# Patient Record
Sex: Male | Born: 1990 | Race: Black or African American | Hispanic: No | Marital: Single | Smoking: Former smoker
Health system: Southern US, Community
[De-identification: ages and names within clinical notes are randomized; demographics above are authoritative.]

## PROBLEM LIST (undated history)

## (undated) HISTORY — PX: CIRCUMCISION REVISION: SHX1347

---
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
## 2010-05-29 ENCOUNTER — Emergency Department (HOSPITAL_COMMUNITY): Admission: EM | Admit: 2010-05-29 | Discharge: 2010-05-29 | Payer: Self-pay | Admitting: Emergency Medicine

## 2011-05-01 NOTE — Op Note (Signed)
[place]  Patient:    Jerry Brooks, Jerry Brooks                  MRN: [id number] Proc. Date: 10/28/00 Adm. Date:  [id number] Attending:  Carlos Levering CC:         [facility]   Operative Report  PREOPERATIVE DIAGNOSIS:  Dense penile adhesions.  POSTOPERATIVE DIAGNOSIS:  Dense penile adhesions.  PROCEDURE:  Lysis of dense penile adhesions and repair.  SURGEON:  Prabhakar D. Levie Heritage, M.D.  ASSISTANT:  Nurse.  ANESTHESIA:  Nurse.  OPERATIVE PROCEDURE:  Under satisfactory general anesthesia the patient in supine position, genitalia region was thoroughly prepped and draped in the usual manner.  A careful inspection revealed 2 dense adhesion bands, 1 located at 10 oclock and the other at 8 oclock position at the circumference of the glands penis.  Both these adhesions bands were exposed and cut with electrocautery.  Debridement was done of the edges and repair was done with 4-0 chromic as well as 5-0 chromic interrupted and some running interlocking sutures.  Hemostasis was satisfactory.  Neosporin applied throughout the procedure.  The patients vital signs remained stable.  The patient withstood the procedure well and was transferred to recovery room in satisfactory general condition. DD:  10/28/00 TD:  10/28/00 Job: [id number]

## 2011-07-26 ENCOUNTER — Emergency Department (HOSPITAL_COMMUNITY): Payer: No Typology Code available for payment source

## 2011-07-26 ENCOUNTER — Emergency Department (HOSPITAL_COMMUNITY)
Admission: EM | Admit: 2011-07-26 | Discharge: 2011-07-26 | Disposition: A | Discharge status: Home / Self Care | Payer: No Typology Code available for payment source | Attending: Emergency Medicine | Admitting: Emergency Medicine

## 2011-07-26 DIAGNOSIS — M7989 Other specified soft tissue disorders: Secondary | ICD-10-CM | POA: Insufficient documentation

## 2011-07-26 DIAGNOSIS — M542 Cervicalgia: Secondary | ICD-10-CM | POA: Insufficient documentation

## 2011-07-26 DIAGNOSIS — S41109A Unspecified open wound of unspecified upper arm, initial encounter: Secondary | ICD-10-CM | POA: Insufficient documentation

## 2011-07-26 DIAGNOSIS — R51 Headache: Secondary | ICD-10-CM | POA: Insufficient documentation

## 2011-07-26 DIAGNOSIS — T07XXXA Unspecified multiple injuries, initial encounter: Secondary | ICD-10-CM | POA: Insufficient documentation

## 2011-07-26 DIAGNOSIS — R079 Chest pain, unspecified: Secondary | ICD-10-CM | POA: Insufficient documentation

## 2011-07-26 DIAGNOSIS — S0003XA Contusion of scalp, initial encounter: Secondary | ICD-10-CM | POA: Insufficient documentation

## 2011-07-26 DIAGNOSIS — IMO0002 Reserved for concepts with insufficient information to code with codable children: Secondary | ICD-10-CM | POA: Insufficient documentation

## 2011-07-26 DIAGNOSIS — M79609 Pain in unspecified limb: Secondary | ICD-10-CM | POA: Insufficient documentation

## 2011-07-26 DIAGNOSIS — S61409A Unspecified open wound of unspecified hand, initial encounter: Secondary | ICD-10-CM | POA: Insufficient documentation

## 2011-07-26 DIAGNOSIS — S0180XA Unspecified open wound of other part of head, initial encounter: Secondary | ICD-10-CM | POA: Insufficient documentation

## 2011-07-26 DIAGNOSIS — S01409A Unspecified open wound of unspecified cheek and temporomandibular area, initial encounter: Secondary | ICD-10-CM | POA: Insufficient documentation

## 2011-07-26 DIAGNOSIS — S40029A Contusion of unspecified upper arm, initial encounter: Secondary | ICD-10-CM | POA: Insufficient documentation

## 2011-07-26 DIAGNOSIS — S0120XA Unspecified open wound of nose, initial encounter: Secondary | ICD-10-CM | POA: Insufficient documentation

## 2011-07-26 LAB — BASIC METABOLIC PANEL
BUN: 7 mg/dL (ref 6–23)
CO2: 26 mEq/L (ref 19–32)
Calcium: 9.3 mg/dL (ref 8.4–10.5)
GFR calc non Af Amer: >60 mL/min (ref >60)
Glucose, Bld: 94 mg/dL (ref 70–99)

## 2011-07-26 LAB — DIFFERENTIAL
Eosinophils Relative: 1 % (ref 0–5)
Lymphocytes Relative: 39 % (ref 12–46)
Lymphs Abs: 2.9 10*3/uL (ref 0.7–4.0)
Monocytes Absolute: 0.7 10*3/uL (ref 0.1–1.0)
Monocytes Relative: 10 % (ref 3–12)
Neutro Abs: 3.6 10*3/uL (ref 1.7–7.7)

## 2011-07-26 LAB — CBC
HCT: 44.1 % (ref 39.0–52.0)
Hemoglobin: 15.8 g/dL (ref 13.0–17.0)
MCH: 32.6 pg (ref 26.0–34.0)
MCHC: 35.8 g/dL (ref 30.0–36.0)
MCV: 91.1 fL (ref 78.0–100.0)
RDW: 12.5 % (ref 11.5–15.5)

## 2011-07-27 ENCOUNTER — Emergency Department (HOSPITAL_COMMUNITY)
Admission: EM | Admit: 2011-07-27 | Discharge: 2011-07-27 | Discharge status: Against Medical Advice | Payer: Self-pay | Attending: Emergency Medicine | Admitting: Emergency Medicine

## 2011-07-27 ENCOUNTER — Emergency Department (HOSPITAL_COMMUNITY)
Admission: EM | Admit: 2011-07-27 | Discharge: 2011-07-27 | Discharge status: Against Medical Advice | Payer: No Typology Code available for payment source | Attending: Emergency Medicine | Admitting: Emergency Medicine

## 2011-07-27 DIAGNOSIS — R51 Headache: Secondary | ICD-10-CM | POA: Insufficient documentation

## 2011-07-27 DIAGNOSIS — M549 Dorsalgia, unspecified: Secondary | ICD-10-CM | POA: Insufficient documentation

## 2011-07-27 DIAGNOSIS — L988 Other specified disorders of the skin and subcutaneous tissue: Secondary | ICD-10-CM | POA: Insufficient documentation

## 2011-07-27 DIAGNOSIS — M25519 Pain in unspecified shoulder: Secondary | ICD-10-CM | POA: Insufficient documentation

## 2011-07-27 DIAGNOSIS — M542 Cervicalgia: Secondary | ICD-10-CM | POA: Insufficient documentation

## 2011-07-31 ENCOUNTER — Emergency Department (HOSPITAL_COMMUNITY)
Admission: EM | Admit: 2011-07-31 | Discharge: 2011-07-31 | Disposition: A | Discharge status: Home / Self Care | Payer: No Typology Code available for payment source | Attending: Emergency Medicine | Admitting: Emergency Medicine

## 2011-07-31 DIAGNOSIS — IMO0002 Reserved for concepts with insufficient information to code with codable children: Secondary | ICD-10-CM | POA: Insufficient documentation

## 2011-07-31 DIAGNOSIS — F411 Generalized anxiety disorder: Secondary | ICD-10-CM | POA: Insufficient documentation

## 2011-07-31 DIAGNOSIS — S0510XA Contusion of eyeball and orbital tissues, unspecified eye, initial encounter: Secondary | ICD-10-CM | POA: Insufficient documentation

## 2011-07-31 DIAGNOSIS — H5710 Ocular pain, unspecified eye: Secondary | ICD-10-CM | POA: Insufficient documentation

## 2011-07-31 DIAGNOSIS — R51 Headache: Secondary | ICD-10-CM | POA: Insufficient documentation

## 2011-09-22 LAB — CULTURE, ROUTINE-ABSCESS

## 2012-12-23 ENCOUNTER — Emergency Department (HOSPITAL_COMMUNITY)
Admission: EM | Admit: 2012-12-23 | Discharge: 2012-12-23 | Disposition: A | Discharge status: Home / Self Care | Payer: Self-pay | Attending: Emergency Medicine | Admitting: Emergency Medicine

## 2012-12-23 ENCOUNTER — Encounter (HOSPITAL_COMMUNITY): Payer: Self-pay

## 2012-12-23 DIAGNOSIS — K089 Disorder of teeth and supporting structures, unspecified: Secondary | ICD-10-CM | POA: Insufficient documentation

## 2012-12-23 DIAGNOSIS — F17200 Nicotine dependence, unspecified, uncomplicated: Secondary | ICD-10-CM | POA: Insufficient documentation

## 2012-12-23 DIAGNOSIS — K0889 Other specified disorders of teeth and supporting structures: Secondary | ICD-10-CM

## 2012-12-23 MED ORDER — KETOROLAC TROMETHAMINE 60 MG/2ML IM SOLN
60.0000 mg | Freq: Once | INTRAMUSCULAR | Status: AC
  Administered 2012-12-23: 60 mg via INTRAMUSCULAR
  Filled 2012-12-23: qty 2

## 2012-12-23 MED ORDER — PENICILLIN V POTASSIUM 500 MG PO TABS: 500.0000 mg | ORAL_TABLET | Freq: Four times a day (QID) | ORAL | Status: DC

## 2012-12-23 MED ORDER — NAPROXEN 500 MG PO TABS: 500.0000 mg | ORAL_TABLET | Freq: Two times a day (BID) | ORAL | Status: DC

## 2012-12-23 NOTE — ED Notes (Signed)
Pt dc to home.  Pt states understanding to dc paperwork.  No adv rx noted to injection.  Pt ambulatory to exit.

## 2012-12-23 NOTE — ED Notes (Signed)
Pt c/o toothache x 1 month.  States is unable to f/u with dentist.

## 2012-12-23 NOTE — ED Provider Notes (Signed)
History     CSN: [id number]  Arrival date & time 12/23/12  0327   First MD Initiated Contact with Patient 12/23/12 [phone]      Chief Complaint  Patient presents with  . Dental Pain    (Consider location/radiation/quality/duration/timing/severity/associated sxs/prior treatment) HPI Comments: 22 year old male with a history of dental pain in his right upper rear molar who presents with 2 months of persistent pain which is gradually worsening has become moderate to severe overnight. He denies swelling, fever, chills, nausea, vomiting or difficulty swallowing or speaking. The symptoms were gradual in onset and have gradually gotten worse and he has not seen a dentist for them.  The history is provided by the patient.    History reviewed. No pertinent past medical history.  No past surgical history on file.  No family history on file.  History  Substance Use Topics  . Smoking status: Current Every Day Smoker  . Smokeless tobacco: Not on file  . Alcohol Use: 1.2 oz/week    2 Cans of beer per week      Review of Systems  Constitutional: Negative for fever and chills.  HENT: Positive for dental problem. Negative for sore throat, facial swelling, trouble swallowing and voice change.        Toothache  Gastrointestinal: Negative for nausea and vomiting.    Allergies  Review of patient's allergies indicates no known allergies.  Home Medications   Current Outpatient Rx  Name  Route  Sig  Dispense  Refill  . NAPROXEN 500 MG PO TABS   Oral   Take 1 tablet (500 mg total) by mouth 2 (two) times daily with a meal.   30 tablet   0   . PENICILLIN V POTASSIUM 500 MG PO TABS   Oral   Take 1 tablet (500 mg total) by mouth 4 (four) times daily.   40 tablet   0     BP 117/70  Temp 97.2 F (36.2 C) (Oral)  Resp 20  SpO2 97%  Physical Exam  Nursing note and vitals reviewed. Constitutional: He appears well-developed and well-nourished. No distress.  HENT:  Head:  Normocephalic and atraumatic.  Mouth/Throat: Oropharynx is clear and moist. No oropharyngeal exudate.       Dental Disease - right upper rear molar with deep caries, no surrounding gingival swelling or abscess, no asymmetry to the jaw, no pain in the sublingual area to palpation  Eyes: Conjunctivae normal are normal. No scleral icterus.  Neck: Normal range of motion. Neck supple. No thyromegaly present.  Cardiovascular: Normal rate and regular rhythm.   Pulmonary/Chest: Effort normal and breath sounds normal.  Lymphadenopathy:    He has no cervical adenopathy.  Neurological: He is alert.  Skin: Skin is warm and dry. No rash noted. He is not diaphoretic.    ED Course  Procedures (including critical care time)  Labs Reviewed - No data to display No results found.   1. Toothache       MDM  Overall the patient is well-appearing, he does have disease to his right upper molar however this time antibiotics and pain medication oral is indicated with dental followup, information given including on-call dentist phone number.  Home with Naprosyn and penicillin        Vida Roller, MD 12/23/12 [phone]

## 2013-03-25 ENCOUNTER — Encounter (HOSPITAL_COMMUNITY): Payer: Self-pay | Admitting: Emergency Medicine

## 2013-03-25 ENCOUNTER — Emergency Department (HOSPITAL_COMMUNITY)
Admission: EM | Admit: 2013-03-25 | Discharge: 2013-03-25 | Disposition: A | Discharge status: Home / Self Care | Payer: Self-pay | Attending: Emergency Medicine | Admitting: Emergency Medicine

## 2013-03-25 DIAGNOSIS — Z8669 Personal history of other diseases of the nervous system and sense organs: Secondary | ICD-10-CM | POA: Insufficient documentation

## 2013-03-25 DIAGNOSIS — F17200 Nicotine dependence, unspecified, uncomplicated: Secondary | ICD-10-CM | POA: Insufficient documentation

## 2013-03-25 DIAGNOSIS — L0291 Cutaneous abscess, unspecified: Secondary | ICD-10-CM

## 2013-03-25 DIAGNOSIS — L02219 Cutaneous abscess of trunk, unspecified: Secondary | ICD-10-CM | POA: Insufficient documentation

## 2013-03-25 MED ORDER — HYDROCODONE-ACETAMINOPHEN 5-325 MG PO TABS: 2.0000 | ORAL_TABLET | ORAL | Status: DC | PRN

## 2013-03-25 MED ORDER — SULFAMETHOXAZOLE-TRIMETHOPRIM 800-160 MG PO TABS: 2.0000 | ORAL_TABLET | Freq: Two times a day (BID) | ORAL | Status: DC

## 2013-03-25 NOTE — ED Notes (Signed)
Patient discharged with family  Instructions given using the teach back method, patient verbalizes an understanding. Prescriptions given. NAD noted at the time of discharge.

## 2013-03-25 NOTE — ED Provider Notes (Signed)
History     CSN: [id number]  Arrival date & time 03/25/13  4098   First MD Initiated Contact with Patient 03/25/13 [phone]      Chief Complaint  Patient presents with  . Abscess    (Consider location/radiation/quality/duration/timing/severity/associated sxs/prior treatment) HPI Comments: Patient reports that he started noticing pain in the right groin area yesterday. Woke up this morning he noticed a tender, red and lump. He reports that he has had abscesses before. This feels similar. There has not been any drainage. Patient reports that when he is sitting there is no pain, but when he stands up or walks and the area gets rubbed, pain is severe.  Patient is a 22 y.o. male presenting with abscess.  Abscess Associated symptoms: no fever     No past medical history on file.  No past surgical history on file.  No family history on file.  History  Substance Use Topics  . Smoking status: Current Every Day Smoker  . Smokeless tobacco: Not on file  . Alcohol Use: 1.2 oz/week    2 Cans of beer per week      Review of Systems  Constitutional: Negative for fever.  Skin: Positive for color change.    Allergies  Review of patient's allergies indicates no known allergies.  Home Medications   Current Outpatient Rx  Name  Route  Sig  Dispense  Refill  . naproxen (NAPROSYN) 500 MG tablet   Oral   Take 1 tablet (500 mg total) by mouth 2 (two) times daily with a meal.   30 tablet   0   . penicillin v potassium (VEETID) 500 MG tablet   Oral   Take 1 tablet (500 mg total) by mouth 4 (four) times daily.   40 tablet   0     There were no vitals taken for this visit.  Physical Exam  Constitutional: He is oriented to person, place, and time. He appears well-developed and well-nourished. No distress.  HENT:  Head: Normocephalic and atraumatic.  Right Ear: Hearing normal.  Mouth/Throat: Mucous membranes are normal.  Eyes: Pupils are equal, round, and reactive to light.   Neck: Normal range of motion.  Cardiovascular: Normal rate, regular rhythm, S1 normal and S2 normal.  Exam reveals no gallop and no friction rub.   No murmur heard. Pulmonary/Chest: Effort normal. No respiratory distress. He exhibits no tenderness.  Abdominal: Soft. Normal appearance. There is no hepatosplenomegaly. There is no tenderness. There is no rebound, no guarding, no tenderness at McBurney's point and negative Murphy's sign. No hernia.  Genitourinary: Penis normal.  Musculoskeletal: Normal range of motion.  Neurological: He is alert and oriented to person, place, and time. He has normal strength. No cranial nerve deficit or sensory deficit. Coordination normal. GCS eye subscore is 4. GCS verbal subscore is 5. GCS motor subscore is 6.  Skin: Skin is warm, dry and intact. No rash noted. No cyanosis.  1.5 cm x 3 cm area of erythema immediately adjacent to the scrotum on the right side in the groin area. Area is tender to the touch. Not indurated and no clear fluctuance.  Psychiatric: He has a normal mood and affect. His speech is normal and behavior is normal. Thought content normal.    ED Course  Procedures (including critical care time)  Labs Reviewed - No data to display No results found.   Diagnosis: Abscess    MDM  Patient presents to the ER with an early skin abscess. Although it  has only been present for approximately 24 hours, I do suspect that there is some fluid in it based on examination. I recommended incision and drainage to the patient. He has had this seizure performed before and does not wish to undergo at this time. He has if he can be placed on antibiotics and followup in 24 hours if not improving. I did counsel him about the possibility of extension into the perirectal region as well as the scrotum, including life-threatening problem such as Fournier's gangrene. He understands the risks, but prefers conservative treatment at this time. Patient will be placed on  high-dose Bactrim regimen, given analgesia. Patient is to return at any point if the area worsens. He was told to come back in the next 24-48 hours if he is not seeing improvement.      Gilda Crease, MD 03/25/13 [phone]

## 2013-03-25 NOTE — ED Notes (Signed)
Patient presents to the ED with complaints of pain in the right groin area since. He states I have an abscess. Edema and redness noted to the right groin area.

## 2013-03-26 ENCOUNTER — Encounter (HOSPITAL_COMMUNITY): Payer: Self-pay | Admitting: Emergency Medicine

## 2013-03-26 ENCOUNTER — Emergency Department (HOSPITAL_COMMUNITY)
Admission: EM | Admit: 2013-03-26 | Discharge: 2013-03-26 | Disposition: A | Discharge status: Home Health | Payer: Self-pay | Attending: Emergency Medicine | Admitting: Emergency Medicine

## 2013-03-26 DIAGNOSIS — F17200 Nicotine dependence, unspecified, uncomplicated: Secondary | ICD-10-CM | POA: Insufficient documentation

## 2013-03-26 DIAGNOSIS — L02419 Cutaneous abscess of limb, unspecified: Secondary | ICD-10-CM | POA: Insufficient documentation

## 2013-03-26 DIAGNOSIS — L0291 Cutaneous abscess, unspecified: Secondary | ICD-10-CM

## 2013-03-26 DIAGNOSIS — L539 Erythematous condition, unspecified: Secondary | ICD-10-CM | POA: Insufficient documentation

## 2013-03-26 NOTE — ED Notes (Signed)
Pt discharged to home with family. NAD.  

## 2013-03-26 NOTE — ED Notes (Signed)
Pt presents to ED today with c/o abscess on right inner thigh. Pt states he was here yesterday and got antibiotics, but pain is worse today and wants abscess lanced. NAD.

## 2013-03-26 NOTE — ED Provider Notes (Signed)
History     CSN: [id number]  Arrival date & time 03/26/13  6295   First MD Initiated Contact with Patient 03/26/13 [phone]      Chief Complaint  Patient presents with  . Abscess    (Consider location/radiation/quality/duration/timing/severity/associated sxs/prior treatment) HPI Comments: Patient presents complaining of pain associated with an abscess to his inner right thigh. States the pain is pressure-like in nature and nonradiating, worsening since onset. Patient was evaluated yesterday for abscess and discharged with Bactrim course; patient denied incision and drainage at this time. Patient states that redness and swelling have improved, but pain has increased. He denies any fever, scrotal or penile swelling, penile discharge, numbness or tingling in his lower extremities, and urinary symptoms.    Patient's abscess began to spontaneously drain copious amounts of yellow puslike discharge during gathering of the HPI. Patient endorses improvement to pressure-like pain sensation.  Patient is a 22 y.o. male presenting with abscess. The history is provided by the patient. No language interpreter was used.  Abscess Associated symptoms: no fever     History reviewed. No pertinent past medical history.  History reviewed. No pertinent past surgical history.  History reviewed. No pertinent family history.  History  Substance Use Topics  . Smoking status: Current Every Day Smoker -- 0.50 packs/day    Types: Cigarettes  . Smokeless tobacco: Not on file  . Alcohol Use: 1.2 oz/week    2 Cans of beer per week      Review of Systems  Constitutional: Negative for fever and chills.  Gastrointestinal: Negative for rectal pain.  Genitourinary: Negative for dysuria, hematuria, penile swelling, scrotal swelling and testicular pain.  Skin: Positive for color change. Negative for pallor.  Neurological: Negative for weakness and numbness.  All other systems reviewed and are  negative.    Allergies  Review of patient's allergies indicates no known allergies.  Home Medications   Current Outpatient Rx  Name  Route  Sig  Dispense  Refill  . HYDROcodone-acetaminophen (NORCO/VICODIN) 5-325 MG per tablet   Oral   Take 2 tablets by mouth every 4 (four) hours as needed for pain.   10 tablet   0   . sulfamethoxazole-trimethoprim (SEPTRA DS) 800-160 MG per tablet   Oral   Take 2 tablets by mouth every 12 (twelve) hours.   40 tablet   0     BP 131/71  Pulse 101  Temp(Src) 98.8 F (37.1 C) (Oral)  Ht 5\' 11"  (1.803 m)  Wt 180 lb (81.647 kg)  BMI 25.12 kg/m2  SpO2 99%  Physical Exam  Nursing note and vitals reviewed. Constitutional: He is oriented to person, place, and time. He appears well-developed and well-nourished. No distress.  HENT:  Head: Normocephalic and atraumatic.  Eyes: Conjunctivae are normal. No scleral icterus.  Neck: Normal range of motion.  Cardiovascular: Intact distal pulses.   DP and PT pulses 2+ b/l  Pulmonary/Chest: Effort normal.  Neurological: He is alert and oriented to person, place, and time.  Skin: He is not diaphoretic.  Abscess to R inner thigh with actively draining central area of fluctuance; drainage yellow in color and pus-like. Area of central fluctuance approximately 3.5cmx1cm. Mild surrounding erythema extending approximately 1cm from area of central fluctuance. No warmth or tenderness to palpation.  Psychiatric: He has a normal mood and affect. His behavior is normal.    ED Course  Procedures (including critical care time)  Labs Reviewed - No data to display No results found.   1.  Abscess     MDM  Patient presents for pain secondary to abscess of R inner thigh. Patient evaluated yesterday by Dr. Blinda Leatherwood who d/c'd patient on bactrim. During ED visit today, abscess began to spontaneously drain copious amounts of yellow pus-like discharge which relieved patient's pain.   I discussed with the patient the  option of opening the wound further with incision to promote better drainage versus continuing warm soaks. Patient would prefer to continue the warm soaks rather than have incision and drainage performed today. Have advised patient to continue antibiotics for full course as well. Indications for ED return discussed. Patient states comfort and understanding with this discharge plan and no unaddressed concerns. Patient well and nontoxic appearing in NAD; stable for d/c with PCP follow up. Patient ED work up and management discussed with Dr. Blinda Leatherwood who is in agreement.       Antony Madura, PA-C 03/28/13 2158

## 2013-03-28 NOTE — ED Provider Notes (Signed)
Medical screening examination/treatment/procedure(s) were performed by non-physician practitioner and as supervising physician I was immediately available for consultation/collaboration.  Gilda Crease, MD 03/28/13 2216

## 2013-11-30 ENCOUNTER — Emergency Department (HOSPITAL_COMMUNITY)
Admission: EM | Admit: 2013-11-30 | Discharge: 2013-11-30 | Disposition: A | Discharge status: Home / Self Care | Payer: No Typology Code available for payment source | Attending: Emergency Medicine | Admitting: Emergency Medicine

## 2013-11-30 ENCOUNTER — Encounter (HOSPITAL_COMMUNITY): Payer: Self-pay | Admitting: Emergency Medicine

## 2013-11-30 DIAGNOSIS — K047 Periapical abscess without sinus: Secondary | ICD-10-CM | POA: Insufficient documentation

## 2013-11-30 DIAGNOSIS — F17200 Nicotine dependence, unspecified, uncomplicated: Secondary | ICD-10-CM | POA: Insufficient documentation

## 2013-11-30 DIAGNOSIS — K029 Dental caries, unspecified: Secondary | ICD-10-CM | POA: Insufficient documentation

## 2013-11-30 MED ORDER — PENICILLIN V POTASSIUM 500 MG PO TABS: 500.0000 mg | ORAL_TABLET | Freq: Four times a day (QID) | ORAL | Status: AC

## 2013-11-30 MED ORDER — IBUPROFEN 800 MG PO TABS: 800.0000 mg | ORAL_TABLET | Freq: Three times a day (TID) | ORAL | Status: DC

## 2013-11-30 MED ORDER — OXYCODONE-ACETAMINOPHEN 5-325 MG PO TABS
2.0000 | ORAL_TABLET | Freq: Once | ORAL | Status: AC
  Administered 2013-11-30: 2 via ORAL
  Filled 2013-11-30: qty 2

## 2013-11-30 NOTE — ED Notes (Signed)
Pt presents with c/o toothache on the top right  States it started about 9pm last night

## 2013-12-01 NOTE — ED Provider Notes (Signed)
Medical screening examination/treatment/procedure(s) were performed by non-physician practitioner and as supervising physician I was immediately available for consultation/collaboration.  Hurman Horn, MD 12/01/13 2049

## 2013-12-11 NOTE — ED Provider Notes (Signed)
Medical screening examination/treatment/procedure(s) were performed by non-physician practitioner and as supervising physician I was immediately available for consultation/collaboration.  Hurman Horn, MD 12/11/13 2132

## 2013-12-11 NOTE — ED Provider Notes (Signed)
CSN: [id number]     Arrival date & time 11/30/13  0534 History   First MD Initiated Contact with Patient 11/30/13 [phone]     Chief Complaint  Patient presents with  . Dental Pain   (Consider location/radiation/quality/duration/timing/severity/associated sxs/prior Treatment) Patient is a 22 y.o. male presenting with tooth pain.  Dental Pain Toothache location: Right upper dentition. Severity:  Moderate Onset quality:  Sudden Duration:  1 day Timing:  Constant Progression:  Worsening Chronicity:  New Context: dental caries and poor dentition   Context: not trauma   Worsened by:  Touching and pressure Associated symptoms: no difficulty swallowing, no drooling, no facial swelling, no fever, no neck pain, no neck swelling, no oral bleeding, no oral lesions and no trismus   Risk factors: lack of dental care, periodontal disease and smoking     History reviewed. No pertinent past medical history. History reviewed. No pertinent past surgical history. Family History  Problem Relation Age of Onset  . Diabetes Other   . Hypertension Other    History  Substance Use Topics  . Smoking status: Current Every Day Smoker -- 0.50 packs/day    Types: Cigarettes  . Smokeless tobacco: Not on file  . Alcohol Use: 0.0 oz/week     Comment: occ    Review of Systems  Constitutional: Negative for fever.  HENT: Positive for dental problem. Negative for drooling, facial swelling, mouth sores and trouble swallowing.   Respiratory: Negative for shortness of breath.   Musculoskeletal: Negative for neck pain and neck stiffness.  All other systems reviewed and are negative.    Allergies  Review of patient's allergies indicates no known allergies.  Home Medications   Current Outpatient Rx  Name  Route  Sig  Dispense  Refill  . ibuprofen (ADVIL,MOTRIN) 200 MG tablet   Oral   Take 200 mg by mouth every 6 (six) hours as needed.         Marland Kitchen ibuprofen (ADVIL,MOTRIN) 800 MG tablet   Oral   Take 1  tablet (800 mg total) by mouth 3 (three) times daily.   21 tablet   0    BP 118/70  Pulse 80  Temp(Src) 98.1 F (36.7 C) (Oral)  Ht 5\' 11"  (1.803 m)  Wt 185 lb (83.915 kg)  BMI 25.81 kg/m2  SpO2 97%  Physical Exam  Nursing note and vitals reviewed. Constitutional: He is oriented to person, place, and time. He appears well-developed and well-nourished. No distress.  HENT:  Head: Normocephalic and atraumatic.  Right Ear: External ear normal. No tenderness. No mastoid tenderness.  Left Ear: External ear normal. No tenderness. No mastoid tenderness.  Nose: Nose normal.  Mouth/Throat: Uvula is midline, oropharynx is clear and moist and mucous membranes are normal. No trismus in the jaw. Abnormal dentition. Dental caries present.  Gingival and buccal swelling adjacent to right upper dentition. No oral lesions or bleeding. No trismus. Patient tolerating secretions without difficulty.  Eyes: Conjunctivae and EOM are normal. Pupils are equal, round, and reactive to light. No scleral icterus.  Neck: Normal range of motion. Neck supple.  No nuchal rigidity or meningismus  Pulmonary/Chest: Effort normal. No stridor. No respiratory distress.  Musculoskeletal: Normal range of motion.  Neurological: He is alert and oriented to person, place, and time.  Skin: Skin is warm and dry. No rash noted. He is not diaphoretic. No erythema. No pallor.  Psychiatric: He has a normal mood and affect. His behavior is normal.    ED Course  Procedures (  including critical care time) Labs Review Labs Reviewed - No data to display Imaging Review No results found.  EKG Interpretation   None       MDM   1. Dental abscess    Uncomplicated dental abscess. Patient well and nontoxic appearing, hemodynamically stable, and afebrile. Patient tolerating secretions without difficulty. No red flags or signs concerning for Ludwig's angina. Have urged dental followup and discuss with patient the need for drainage  of dental abscess. Will prescribe penicillin for symptoms and have advised ibuprofen for pain control. Return precautions discussed and patient agreeable to plan with no unaddressed concerns.    Antony Madura, PA-C 12/11/13 1942

## 2014-01-04 ENCOUNTER — Encounter (HOSPITAL_COMMUNITY): Payer: Self-pay | Admitting: Emergency Medicine

## 2014-01-04 ENCOUNTER — Emergency Department (HOSPITAL_COMMUNITY)
Admission: EM | Admit: 2014-01-04 | Discharge: 2014-01-04 | Disposition: A | Discharge status: Home / Self Care | Payer: No Typology Code available for payment source | Attending: Emergency Medicine | Admitting: Emergency Medicine

## 2014-01-04 DIAGNOSIS — F17200 Nicotine dependence, unspecified, uncomplicated: Secondary | ICD-10-CM | POA: Insufficient documentation

## 2014-01-04 DIAGNOSIS — K089 Disorder of teeth and supporting structures, unspecified: Secondary | ICD-10-CM | POA: Insufficient documentation

## 2014-01-04 DIAGNOSIS — K044 Acute apical periodontitis of pulpal origin: Secondary | ICD-10-CM | POA: Insufficient documentation

## 2014-01-04 DIAGNOSIS — K047 Periapical abscess without sinus: Secondary | ICD-10-CM

## 2014-01-04 DIAGNOSIS — K0889 Other specified disorders of teeth and supporting structures: Secondary | ICD-10-CM

## 2014-01-04 MED ORDER — IBUPROFEN 800 MG PO TABS: 800.0000 mg | ORAL_TABLET | Freq: Three times a day (TID) | ORAL | Status: DC

## 2014-01-04 MED ORDER — AMOXICILLIN 500 MG PO CAPS: 500.0000 mg | ORAL_CAPSULE | Freq: Three times a day (TID) | ORAL | Status: DC
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
## 2014-01-04 NOTE — Discharge Instructions (Signed)
Take antibiotic to completion.  Facial Infection You have an infection of your face. This requires special attention to help prevent serious problems. Infections in facial wounds can cause poor healing and scars. They can also spread to deeper tissues, especially around the eye. Wound and dental infections can lead to sinusitis, infection of the eye socket, and even meningitis. Permanent damage to the skin, eye, and nervous system may result if facial infections are not treated properly. With severe infections, hospital care for IV antibiotic injections may be needed if they don't respond to oral antibiotics. Antibiotics must be taken for the full course to insure the infection is eliminated. If the infection came from a bad tooth, it may have to be extracted when the infection is under control. Warm compresses may be applied to reduce skin irritation and remove drainage. You might need a tetanus shot now if:  You cannot remember when your last tetanus shot was.  You have never had a tetanus shot.  The object that caused your wound was dirty. If you need a tetanus shot, and you decide not to get one, there is a rare chance of getting tetanus. Sickness from tetanus can be serious. If you got a tetanus shot, your arm may swell, get red and warm to the touch at the shot site. This is common and not a problem. SEEK IMMEDIATE MEDICAL CARE IF:   You have increased swelling, redness, or trouble breathing.  You have a severe headache, dizziness, nausea, or vomiting.  You develop problems with your eyesight.  You have a fever. Document Released: 01/07/2005 Document Revised: 02/22/2012 Document Reviewed: 11/30/2005 [facility] Patient Information 2014 [place], [place].  Dental Pain A tooth ache may be caused by cavities (tooth decay). Cavities expose the nerve of the tooth to air and hot or cold temperatures. It may come from an infection or abscess (also called a boil or furuncle) around your tooth. It  is also often caused by dental caries (tooth decay). This causes the pain you are having. DIAGNOSIS  Your caregiver can diagnose this problem by exam. TREATMENT   If caused by an infection, it may be treated with medications which kill germs (antibiotics) and pain medications as prescribed by your caregiver. Take medications as directed.  Only take over-the-counter or prescription medicines for pain, discomfort, or fever as directed by your caregiver.  Whether the tooth ache today is caused by infection or dental disease, you should see your dentist as soon as possible for further care. SEEK MEDICAL CARE IF: The exam and treatment you received today has been provided on an emergency basis only. This is not a substitute for complete medical or dental care. If your problem worsens or new problems (symptoms) appear, and you are unable to meet with your dentist, call or return to this location. SEEK IMMEDIATE MEDICAL CARE IF:   You have a fever.  You develop redness and swelling of your face, jaw, or neck.  You are unable to open your mouth.  You have severe pain uncontrolled by pain medicine. MAKE SURE YOU:   Understand these instructions.  Will watch your condition.  Will get help right away if you are not doing well or get worse. Document Released: 11/30/2005 Document Revised: 02/22/2012 Document Reviewed: 07/18/2008 [facility] Patient Information 2014 [place], [place].  Dental Care and Dentist Visits Dental care supports good overall health. Regular dental visits can also help you avoid dental pain, bleeding, infection, and other more serious health problems in the future. It  is important to keep the mouth healthy because diseases in the teeth, gums, and other oral tissues can spread to other areas of the body. Some problems, such as diabetes, heart disease, and pre-term labor have been associated with poor oral health.  See your dentist every 6 months. If you experience emergency  problems such as a toothache or broken tooth, go to the dentist right away. If you see your dentist regularly, you may catch problems early. It is easier to be treated for problems in the early stages.  WHAT TO EXPECT AT A DENTIST VISIT  Your dentist will look for many common oral health problems and recommend proper treatment. At your regular dental visit, you can expect:  Gentle cleaning of the teeth and gums. This includes scraping and polishing. This helps to remove the sticky substance around the teeth and gums (plaque). Plaque forms in the mouth shortly after eating. Over time, plaque hardens on the teeth as tartar. If tartar is not removed regularly, it can cause problems. Cleaning also helps remove stains.  Periodic X-rays. These pictures of the teeth and supporting bone will help your dentist assess the health of your teeth.  Periodic fluoride treatments. Fluoride is a natural mineral shown to help strengthen teeth. Fluoride treatmentinvolves applying a fluoride gel or varnish to the teeth. It is most commonly done in children.  Examination of the mouth, tongue, jaws, teeth, and gums to look for any oral health problems, such as:  Cavities (dental caries). This is decay on the tooth caused by plaque, sugar, and acid in the mouth. It is best to catch a cavity when it is small.  Inflammation of the gums caused by plaque buildup (gingivitis).  Problems with the mouth or malformed or misaligned teeth.  Oral cancer or other diseases of the soft tissues or jaws. KEEP YOUR TEETH AND GUMS HEALTHY For healthy teeth and gums, follow these general guidelines as well as your dentist's specific advice:  Have your teeth professionally cleaned at the dentist every 6 months.  Brush twice daily with a fluoride toothpaste.  Floss your teeth daily.  Ask your dentist if you need fluoride supplements, treatments, or fluoride toothpaste.  Eat a healthy diet. Reduce foods and drinks with added  sugar.  Avoid smoking. TREATMENT FOR ORAL HEALTH PROBLEMS If you have oral health problems, treatment varies depending on the conditions present in your teeth and gums.  Your caregiver will most likely recommend good oral hygiene at each visit.  For cavities, gingivitis, or other oral health disease, your caregiver will perform a procedure to treat the problem. This is typically done at a separate appointment. Sometimes your caregiver will refer you to another dental specialist for specific tooth problems or for surgery. SEEK IMMEDIATE DENTAL CARE IF:  You have pain, bleeding, or soreness in the gum, tooth, jaw, or mouth area.  A permanent tooth becomes loose or separated from the gum socket.  You experience a blow or injury to the mouth or jaw area. Document Released: 08/12/2011 Document Revised: 02/22/2012 Document Reviewed: 08/12/2011 [facility] Patient Information 2014 [place].

## 2014-01-04 NOTE — ED Notes (Signed)
PT ambulated with baseline gait; VSS; A&Ox3; no signs of distress; respirations even and unlabored; skin warm and dry; no questions upon discharge.  

## 2014-01-04 NOTE — ED Notes (Signed)
Upper right back molar decayed and painful; nausea; no fevers or chills. No facial swelling noted

## 2014-01-05 NOTE — ED Provider Notes (Signed)
Medical screening examination/treatment/procedure(s) were performed by non-physician practitioner and as supervising physician I was immediately available for consultation/collaboration.  EKG Interpretation   None         Deysy Schabel W Joshus Rogan, MD 01/05/14 1143 

## 2014-02-04 ENCOUNTER — Encounter (HOSPITAL_COMMUNITY): Payer: Self-pay | Admitting: Emergency Medicine

## 2014-02-04 ENCOUNTER — Emergency Department (HOSPITAL_COMMUNITY)
Admission: EM | Admit: 2014-02-04 | Discharge: 2014-02-04 | Disposition: A | Discharge status: Home / Self Care | Payer: No Typology Code available for payment source | Attending: Emergency Medicine | Admitting: Emergency Medicine

## 2014-02-04 DIAGNOSIS — K0889 Other specified disorders of teeth and supporting structures: Secondary | ICD-10-CM

## 2014-02-04 DIAGNOSIS — Z792 Long term (current) use of antibiotics: Secondary | ICD-10-CM | POA: Insufficient documentation

## 2014-02-04 DIAGNOSIS — K089 Disorder of teeth and supporting structures, unspecified: Secondary | ICD-10-CM | POA: Insufficient documentation

## 2014-02-04 DIAGNOSIS — Z791 Long term (current) use of non-steroidal anti-inflammatories (NSAID): Secondary | ICD-10-CM | POA: Insufficient documentation

## 2014-02-04 DIAGNOSIS — K029 Dental caries, unspecified: Secondary | ICD-10-CM | POA: Insufficient documentation

## 2014-02-04 DIAGNOSIS — F17200 Nicotine dependence, unspecified, uncomplicated: Secondary | ICD-10-CM | POA: Insufficient documentation

## 2014-02-04 MED ORDER — TRAMADOL HCL 50 MG PO TABS
50.0000 mg | ORAL_TABLET | Freq: Once | ORAL | Status: AC
  Administered 2014-02-04: 50 mg via ORAL
  Filled 2014-02-04: qty 1

## 2014-02-04 MED ORDER — TRAMADOL HCL 50 MG PO TABS: 50.0000 mg | ORAL_TABLET | Freq: Four times a day (QID) | ORAL | Status: DC | PRN

## 2014-02-04 NOTE — ED Notes (Signed)
Pt c/o right upper toothache x couple days. Pt tried ibuprofen without relief.

## 2014-02-04 NOTE — ED Notes (Signed)
Toothache for 2 days 

## 2014-02-04 NOTE — ED Provider Notes (Signed)
CSN: [id number]     Arrival date & time 02/04/14  1610 History  This chart was scribed for non-physician practitioner Marlon Pel working with Ward Givens, MD by Carl Best, ED Scribe. This patient was seen in room TR05C/TR05C and the patient's care was started at 4:34 PM.     Chief Complaint  Patient presents with  . Dental Pain     Patient is a 23 y.o. male presenting with tooth pain. The history is provided by the patient. No language interpreter was used.  Dental Pain Associated symptoms: facial swelling   Associated symptoms: no fever    HPI Comments: SHER HELLINGER is a 23 y.o. male who presents to the Emergency Department complaining of constant, right upper dental pain that started four days ago.  The patient was seen two days ago in the ED for the same symptoms and was discharged with antibiotics and Ibuprofen.  He states that his symptoms have not gotten better.  He states that the antibiotics helped alleviate his pain for a couple of days (states he has had two rounds without significant improvement) and the Ibuprofen has not helped either.  He denies fever as an associated symptom.  He lists facial swelling as an associated symptom.  The patient states that he has tried applying hot and cold compresses to his face and oragel to the area with no relief.  The patient states that he cannot afford a dentist.     History reviewed. No pertinent past medical history. Past Surgical History  Procedure Laterality Date  . Circumcision revision     Family History  Problem Relation Age of Onset  . Diabetes Other   . Hypertension Other    History  Substance Use Topics  . Smoking status: Current Every Day Smoker -- 0.50 packs/day    Types: Cigarettes  . Smokeless tobacco: Not on file  . Alcohol Use: 0.0 oz/week     Comment: occ    Review of Systems  Constitutional: Negative for fever.  HENT: Positive for dental problem and facial swelling.   All other systems reviewed  and are negative.      Allergies  Review of patient's allergies indicates no known allergies.  Home Medications   Current Outpatient Rx  Name  Route  Sig  Dispense  Refill  . amoxicillin (AMOXIL) 500 MG capsule   Oral   Take 1 capsule (500 mg total) by mouth 3 (three) times daily.   21 capsule   0   . ibuprofen (ADVIL,MOTRIN) 800 MG tablet   Oral   Take 1 tablet (800 mg total) by mouth 3 (three) times daily.   21 tablet   0    Triage Vitals: BP 120/65  Pulse 93  Temp(Src) 98.8 F (37.1 C) (Oral)  Resp 18  Wt 173 lb (78.472 kg)  SpO2 97%  Physical Exam  Nursing note and vitals reviewed. Constitutional: He is oriented to person, place, and time. He appears well-developed and well-nourished. No distress.  HENT:  Head: Normocephalic and atraumatic.  Right Ear: External ear normal.  Left Ear: External ear normal.  Nose: Nose normal.  Mouth/Throat: Uvula is midline and mucous membranes are normal. No trismus in the jaw. Abnormal dentition. Dental caries present. No uvula swelling.  Eyes: Conjunctivae and EOM are normal.  Neck: Normal range of motion. Neck supple.  Cardiovascular: Normal rate.   Pulmonary/Chest: Effort normal.  Musculoskeletal: Normal range of motion.  Neurological: He is alert and oriented to person,  place, and time.  Skin: Skin is warm and dry. He is not diaphoretic.  Psychiatric: He has a normal mood and affect. His behavior is normal.    ED Course  Procedures (including critical care time)  DIAGNOSTIC STUDIES: Oxygen Saturation is 97% on room air, normal by my interpretation.    COORDINATION OF CARE: 4:37 PM- Discussed discharging the patient with Ultram  to treat his dental pain.  Advised the patient to see a dentist to get the tooth pulled.  The patient agreed to the treatment plan.    Labs Review Labs Reviewed - No data to display Imaging Review No results found.  EKG Interpretation   None       MDM   Final diagnoses:   Pain, dental    Patient has dental pain. No emergent s/sx's present. Patent airway. No trismus.  Will be given Ultram for pain, Ibuprofen is not helping. I discussed the need to call dentist within 24/48 hours for follow-up. Dental referral given. Return to ED precautions given.  Pt voiced understanding and has agreed to follow-up.    22 y.o.Talbert NanMarcus T Test's evaluation in the Emergency Department is complete. It has been determined that no acute conditions requiring further emergency intervention are present at this time. The patient/guardian have been advised of the diagnosis and plan. We have discussed signs and symptoms that warrant return to the ED, such as changes or worsening in symptoms.  Vital signs are stable at discharge. Filed Vitals:   02/04/14 1620  BP: 120/65  Pulse: 93  Temp: 98.8 F (37.1 C)  Resp: 18    Patient/guardian has voiced understanding and agreed to follow-up with the PCP or specialist.   Dorthula Matasiffany G Avamae Dehaan, PA-C 02/04/14 1647

## 2014-02-04 NOTE — ED Provider Notes (Signed)
Medical screening examination/treatment/procedure(s) were performed by non-physician practitioner and as supervising physician I was immediately available for consultation/collaboration.  EKG Interpretation   None        Dennys Guin, MD, FACEP   Kaliyah Gladman L Shondale Quinley, MD 02/04/14 2340 

## 2014-02-04 NOTE — Discharge Instructions (Signed)

## 2014-03-14 ENCOUNTER — Emergency Department (HOSPITAL_COMMUNITY)
Admission: EM | Admit: 2014-03-14 | Discharge: 2014-03-14 | Disposition: A | Discharge status: Home / Self Care | Payer: No Typology Code available for payment source | Attending: Emergency Medicine | Admitting: Emergency Medicine

## 2014-03-14 ENCOUNTER — Encounter (HOSPITAL_COMMUNITY): Payer: Self-pay | Admitting: Emergency Medicine

## 2014-03-14 DIAGNOSIS — K0889 Other specified disorders of teeth and supporting structures: Secondary | ICD-10-CM

## 2014-03-14 DIAGNOSIS — K089 Disorder of teeth and supporting structures, unspecified: Secondary | ICD-10-CM | POA: Insufficient documentation

## 2014-03-14 DIAGNOSIS — F17200 Nicotine dependence, unspecified, uncomplicated: Secondary | ICD-10-CM | POA: Insufficient documentation

## 2014-03-14 MED ORDER — PENICILLIN V POTASSIUM 500 MG PO TABS: 500.0000 mg | ORAL_TABLET | Freq: Four times a day (QID) | ORAL | Status: DC

## 2014-03-14 NOTE — Progress Notes (Signed)
[facility] did not get to see patient but will be sending information about the [facility] Orange Card program, using the address provided.  °

## 2014-03-14 NOTE — ED Provider Notes (Signed)
Medical screening examination/treatment/procedure(s) were performed by non-physician practitioner and as supervising physician I was immediately available for consultation/collaboration.   EKG Interpretation None        Gilda Creasehristopher J. Jabaree Mercado, MD 03/14/14 1534

## 2014-03-14 NOTE — ED Provider Notes (Signed)
CSN: [id number]     Arrival date & time 03/14/14  1447 History  This chart was scribed for non-physician practitioner, Roxy Horsemanobert Krzysztof Reichelt, PA-C working with Lyanne CoKevin M Campos, MD by Greggory StallionKayla Andersen, ED scribe. This patient was seen in room WTR8/WTR8 and the patient's care was started at 3:27 PM.   Chief Complaint  Patient presents with  . Dental Pain   The history is provided by the patient. No language interpreter was used.   HPI Comments: Jerry Brooks is a 23 y.o. male who presents to the Emergency Department complaining of gradual onset, constant right upper dental pain that started last night. Pt states he was supposed to have the tooth pulled one month ago but never did. He has taken Advil PM with no relief. Denies fever, trouble swallowing, difficulty breathing.   History reviewed. No pertinent past medical history. Past Surgical History  Procedure Laterality Date  . Circumcision revision     Family History  Problem Relation Age of Onset  . Diabetes Other   . Hypertension Other    History  Substance Use Topics  . Smoking status: Current Every Day Smoker -- 0.50 packs/day    Types: Cigarettes  . Smokeless tobacco: Not on file  . Alcohol Use: 0.0 oz/week     Comment: occ    Review of Systems  Constitutional: Negative for fever.  HENT: Positive for dental problem. Negative for trouble swallowing.   Eyes: Negative for redness.  Respiratory: Negative for shortness of breath.   Cardiovascular: Negative for chest pain.  Gastrointestinal: Negative for abdominal distention.  Musculoskeletal: Negative for gait problem.  Skin: Negative for rash.  Neurological: Negative for speech difficulty.  Psychiatric/Behavioral: Negative for confusion.   Allergies  Review of patient's allergies indicates no known allergies.  Home Medications   Current Outpatient Rx  Name  Route  Sig  Dispense  Refill  . ibuprofen (ADVIL,MOTRIN) 800 MG tablet   Oral   Take 1 tablet (800 mg total) by  mouth 3 (three) times daily.   21 tablet   0   . Ibuprofen-Diphenhydramine Cit (ADVIL PM PO)   Oral   Take 2 tablets by mouth daily as needed (tooth pain).         . traMADol (ULTRAM) 50 MG tablet   Oral   Take 1 tablet (50 mg total) by mouth every 6 (six) hours as needed.   15 tablet   0    BP 130/69  Pulse 89  Temp(Src) 97.8 F (36.6 C) (Oral)  Resp 16  SpO2 100%  Physical Exam  Nursing note and vitals reviewed. Constitutional: He is oriented to person, place, and time. He appears well-developed. No distress.  HENT:  Head: Normocephalic and atraumatic.  Mouth/Throat:    Poor dentition throughout.  Affected tooth as diagrammed.  No signs of peritonsillar or tonsillar abscess.  No signs of gingival abscess. Oropharynx is clear and without exudates.  Uvula is midline.  Airway is intact. No signs of Ludwig's angina with palpation of oral and sublingual mucosa.   Eyes: Conjunctivae and EOM are normal.  Cardiovascular: Normal rate and regular rhythm.   Pulmonary/Chest: Effort normal. No stridor. No respiratory distress.  Abdominal: He exhibits no distension.  Musculoskeletal: He exhibits no edema.  Neurological: He is alert and oriented to person, place, and time.  Skin: Skin is warm and dry.  Psychiatric: He has a normal mood and affect.    ED Course  Procedures (including critical care time)  DIAGNOSTIC STUDIES:  Oxygen Saturation is 100% on RA, normal by my interpretation.    COORDINATION OF CARE: 3:29 PM-Discussed treatment plan which includes an antibiotic and alternating tylenol and ibuprofen with pt at bedside and pt agreed to plan. Will give pt dental referrals and advised him to follow up with a dentist.    Labs Review Labs Reviewed - No data to display Imaging Review No results found.   EKG Interpretation None      MDM   Final diagnoses:  Pain, dental    Patient with toothache.  No gross abscess.  Exam unconcerning for Ludwig's angina or  spread of infection.  Will treat with penicillin.  Urged patient to follow-up with dentist.    I personally performed the services described in this documentation, which was scribed in my presence. The recorded information has been reviewed and is accurate.    Roxy Horseman, PA-C 03/14/14 [phone]

## 2014-03-14 NOTE — Discharge Instructions (Signed)
Please take Tylenol and ibuprofen for your dental pain. Please followup with the dentist.  Dental Pain A tooth ache may be caused by cavities (tooth decay). Cavities expose the nerve of the tooth to air and hot or cold temperatures. It may come from an infection or abscess (also called a boil or furuncle) around your tooth. It is also often caused by dental caries (tooth decay). This causes the pain you are having. DIAGNOSIS  Your caregiver can diagnose this problem by exam. TREATMENT   If caused by an infection, it may be treated with medications which kill germs (antibiotics) and pain medications as prescribed by your caregiver. Take medications as directed.  Only take over-the-counter or prescription medicines for pain, discomfort, or fever as directed by your caregiver.  Whether the tooth ache today is caused by infection or dental disease, you should see your dentist as soon as possible for further care. SEEK MEDICAL CARE IF: The exam and treatment you received today has been provided on an emergency basis only. This is not a substitute for complete medical or dental care. If your problem worsens or new problems (symptoms) appear, and you are unable to meet with your dentist, call or return to this location. SEEK IMMEDIATE MEDICAL CARE IF:   You have a fever.  You develop redness and swelling of your face, jaw, or neck.  You are unable to open your mouth.  You have severe pain uncontrolled by pain medicine. MAKE SURE YOU:   Understand these instructions.  Will watch your condition.  Will get help right away if you are not doing well or get worse. Document Released: 11/30/2005 Document Revised: 02/22/2012 Document Reviewed: 07/18/2008 [facility] Patient Information 2014 [place], [place].

## 2014-03-14 NOTE — ED Notes (Signed)
Pt a+ox4, reports c/o pain 9/10 pain to R upper tooth since last night.  Pt reports "i was supposed to have it pulled a month ago".  Pt denies fevers/chills.  Pt denies other complaints.  Speaking full/clear sentences, rr even/un-lab.  No difficulty clearing secretions or maintaining airway.  NAD.

## 2014-06-18 ENCOUNTER — Emergency Department (HOSPITAL_COMMUNITY)
Admission: EM | Admit: 2014-06-18 | Discharge: 2014-06-18 | Disposition: A | Discharge status: Home / Self Care | Payer: No Typology Code available for payment source | Attending: Emergency Medicine | Admitting: Emergency Medicine

## 2014-06-18 ENCOUNTER — Encounter (HOSPITAL_COMMUNITY): Payer: Self-pay | Admitting: Emergency Medicine

## 2014-06-18 DIAGNOSIS — B009 Herpesviral infection, unspecified: Secondary | ICD-10-CM | POA: Insufficient documentation

## 2014-06-18 DIAGNOSIS — F17200 Nicotine dependence, unspecified, uncomplicated: Secondary | ICD-10-CM | POA: Insufficient documentation

## 2014-06-18 DIAGNOSIS — Z79899 Other long term (current) drug therapy: Secondary | ICD-10-CM | POA: Insufficient documentation

## 2014-06-18 DIAGNOSIS — B001 Herpesviral vesicular dermatitis: Secondary | ICD-10-CM

## 2014-06-18 MED ORDER — PENCICLOVIR 1 % EX CREA: 1.0000 "application " | TOPICAL_CREAM | CUTANEOUS | Status: DC

## 2014-06-18 NOTE — ED Provider Notes (Signed)
CSN: [id number]     Arrival date & time 06/18/14  1548 History  This chart was scribed for non-physician practitioner, Johnnette Gourdobyn Albert, PA-C working with Hurman HornJohn M Bednar, MD by Greggory StallionKayla Andersen, ED scribe. This patient was seen in room WTR9/WTR9 and the patient's care was started at 4:05 PM.   Chief Complaint  Patient presents with  . Fever blister    The history is provided by the patient. No language interpreter was used.   HPI Comments: Jerry Brooks is a 23 y.o. male who presents to the Emergency Department complaining of a fever blister to his lower lip that started about one week ago. States it hasn't worsened. Denies any pain around the area. Pt has used Carmex and Abreva with no relief. Denies fever, chills. States no one around him has similar symptoms.   History reviewed. No pertinent past medical history. Past Surgical History  Procedure Laterality Date  . Circumcision revision     Family History  Problem Relation Age of Onset  . Diabetes Other   . Hypertension Other    History  Substance Use Topics  . Smoking status: Current Every Day Smoker -- 0.50 packs/day    Types: Cigarettes  . Smokeless tobacco: Not on file  . Alcohol Use: 0.0 oz/week     Comment: occasional    Review of Systems  Constitutional: Negative for fever and chills.  HENT: Positive for mouth sores.   All other systems reviewed and are negative.  Allergies  Review of patient's allergies indicates no known allergies.  Home Medications   Prior to Admission medications   Medication Sig Start Date End Date Taking? Authorizing Provider  OVER THE COUNTER MEDICATION Take 1 application by mouth 2 (two) times daily.   Yes Historical Provider, MD  penciclovir (DENAVIR) 1 % cream Apply 1 application topically every 2 (two) hours. 06/18/14   Trevor Maceobyn M Albert, PA-C   BP 126/87  Pulse 83  Temp(Src) 98.8 F (37.1 C) (Oral)  Resp 16  Ht 5\' 11"  (1.803 m)  Wt 185 lb (83.915 kg)  BMI 25.81 kg/m2  SpO2  100%  Physical Exam  Nursing note and vitals reviewed. Constitutional: He is oriented to person, place, and time. He appears well-developed and well-nourished. No distress.  HENT:  Head: Normocephalic and atraumatic.  4 mm raised lesion on left side of lower lip consistent with a cold sore.   Eyes: Conjunctivae and EOM are normal.  Neck: Normal range of motion. Neck supple.  Cardiovascular: Normal rate, regular rhythm and normal heart sounds.   Pulmonary/Chest: Effort normal and breath sounds normal.  Musculoskeletal: Normal range of motion. He exhibits no edema.  Neurological: He is alert and oriented to person, place, and time.  Skin: Skin is warm and dry.  Psychiatric: He has a normal mood and affect. His behavior is normal.    ED Course  Procedures (including critical care time)  DIAGNOSTIC STUDIES: Oxygen Saturation is 100% on RA, normal by my interpretation.    COORDINATION OF CARE: 4:07 PM-Discussed treatment plan which includes continuing Abreva with pt at bedside and pt agreed to plan.   Labs Review Labs Reviewed - No data to display  Imaging Review No results found.   EKG Interpretation None      MDM   Final diagnoses:  Cold sore    Tx with topical denavir. Turns given. No mucosal lesions. Stable for discharge. Return precautions given. Patient states understanding of treatment care plan and is agreeable.  I  personally performed the services described in this documentation, which was scribed in my presence. The recorded information has been reviewed and is accurate.  Trevor MaceRobyn M Albert, PA-C 06/18/14 1622

## 2014-06-18 NOTE — ED Notes (Signed)
Pt reports first noticed fever blister to lower lip 1.5 wks ago, states has used OTC cream for it with no relief. Pt is concerned bc he has never had fever blister before.

## 2014-06-18 NOTE — Discharge Instructions (Signed)
Apply denavir cream as directed until subsided.   Cold Sore A cold sore (fever blister) is a skin infection caused by the herpes simplex virus (HSV-1). HSV-1 is closely related to the virus that causes gential herpes (HSV-2), but they are not the same even though both viruses can cause oral and genital infections. Cold sores are small, fluid-filled sores inside of the mouth or on the lips, gums, nose, chin, cheeks, or fingers.  The herpes simplex virus can be easily passed (contagious) to other people through close personal contact, such as kissing or sharing personal items. The virus can also spread to other parts of the body, such as the eyes or genitals. Cold sores are contagious until the sores crust over completely. They often heal within 2 weeks.  Once a person is infected, the herpes simplex virus remains permanently in the body. Therefore, there is no cure for cold sores, and they often recur when a person is tired, stressed, sick, or gets too much sun. Additional factors that can cause a recurrence include hormone changes in menstruation or pregnancy, certain drugs, and cold weather.  CAUSES  Cold sores are caused by the herpes simplex virus. The virus is spread from person to person through close contact, such as through kissing, touching the affected area, or sharing personal items such as lip balm, razors, or eating utensils.  SYMPTOMS  The first infection may not cause symptoms. If symptoms develop, the symptoms often go through different stages. Here is how a cold sore develops:   Tingling, itching, or burning is felt 1-2 days before the outbreak.   Fluid-filled blisters appear on the lips, inside the mouth, nose, or on the cheeks.   The blisters start to ooze clear fluid.   The blisters dry up and a yellow crust appears in its place.   The crust falls off.  Symptoms depend on whether it is the initial outbreak or a recurrence. Some other symptoms with the first outbreak may  include:   Fever.   Sore throat.   Headache.   Muscle aches.   Swollen neck glands.  DIAGNOSIS  A diagnosis is often made based on your symptoms and looking at the sores. Sometimes, a sore may be swabbed and then examined in the lab to make a final diagnosis. If the sores are not present, blood tests can find the herpes simplex virus.  TREATMENT  There is no cure for cold sores and no vaccine for the herpes simplex virus. Within 2 weeks, most cold sores go away on their own without treatment. Medicines cannot make the infection go away, but medicine can help relieve some of the pain associated with the sores, can work to stop the virus from multiplying, and can also shorten healing time. Medicine may be in the form of creams, gels, pills, or a shot.  HOME CARE INSTRUCTIONS   Only take over-the-counter or prescription medicines for pain, discomfort, or fever as directed by your caregiver. Do not use aspirin.   Use a cotton-tip swab to apply creams or gels to your sores.   Do not touch the sores or pick the scabs. Wash your hands often. Do not touch your eyes without washing your hands first.   Avoid kissing, oral sex, and sharing personal items until sores heal.   Apply an ice pack on your sores for 10-15 minutes to ease any discomfort.   Avoid hot, cold, or salty foods because they may hurt your mouth. Eat a soft, bland diet to  avoid irritating the sores. Use a straw to drink if you have pain when drinking out of a glass.   Keep sores clean and dry to prevent an infection of other tissues.   Avoid the sun and limit stress if these things trigger outbreaks. If sun causes cold sores, apply sunscreen on the lips before being out in the sun.  SEEK MEDICAL CARE IF:   You have a fever or persistent symptoms for more than 2-3 days.   You have a fever and your symptoms suddenly get worse.   You have pus, not clear fluid, coming from the sores.   You have redness that  is spreading.   You have pain or irritation in your eye.   You get sores on your genitals.   Your sores do not heal within 2 weeks.   You have a weakened immune system.   You have frequent recurrences of cold sores.  MAKE SURE YOU:   Understand these instructions.  Will watch your condition.  Will get help right away if you are not doing well or get worse. Document Released: 11/27/2000 Document Revised: 08/24/2012 Document Reviewed: 04/13/2012 [facility] Patient Information 2015 [employer], [place]. This information is not intended to replace advice given to you by your health care provider. Make sure you discuss any questions you have with your health care provider.  Herpes Labialis You have a fever blister or cold sore (herpes labialis). These painful, grouped sores are caused by one of the herpes viruses (HSV1 most commonly). They are usually found around the lips and mouth, but the same infection can also affect other areas on the face such as the nose and eyes. Herpes infections take about 10 days to heal. They often occur again and again in the same spot. Other symptoms may include numbness and tingling in the involved skin, achiness, fever, and swollen glands in the neck. Colds, emotional stress, injuries, or excess sunlight exposure all seem to make herpes reappear. Herpes lip infections are contagious. Direct contact with these sores can spread the infection. It can also be spread to other parts of your own body. TREATMENT  Herpes labialis is usually self-limited and resolves within 1 week. To reduce pain and swelling, apply ice packs frequently to the sores or suck on popsicles or frozen juice bars. Antiviral medicine may be used by mouth to shorten the duration of the breakout. Avoid spreading the infection by washing your hands often. Be careful not to touch your eyes or genital areas after handling the infected blisters. Do not kiss or have other intimate contact with others.  After the blisters are completely healed you may resume contact. Use sunscreen to lessen recurrences.  If this is your first infection with herpes, or if you have a severe or repeated infections, your caregiver may prescribe one of the anti-viral drugs to speed up the healing. If you have sun-related flare-ups despite the use of sunscreen, starting oral anti-viral medicine before a prolonged exposure (going skiing or to the beach) can prevent most episodes.  SEEK IMMEDIATE MEDICAL CARE IF:  You develop a headache, sleepiness, high fever, vomiting, or severe weakness.  You have eye irritation, pain, blurred vision or redness.  You develop a prolonged infection not getting better in 10 days. Document Released: 11/30/2005 Document Revised: 02/22/2012 Document Reviewed: 10/04/2009 [facility] Patient Information 2015 [employer] [place], [place]. This information is not intended to replace advice given to you by your health care provider. Make sure you discuss any questions you have with  your health care provider. ° °

## 2014-06-18 NOTE — Progress Notes (Signed)
[facility] provided pt with a list of primary care resources to help patient establish primary care.  °

## 2014-06-19 NOTE — ED Provider Notes (Signed)
Medical screening examination/treatment/procedure(s) were performed by non-physician practitioner and as supervising physician I was immediately available for consultation/collaboration.   EKG Interpretation None       Jerry HornJohn M Kellen Hover, MD 06/19/14 1416

## 2014-06-21 NOTE — Progress Notes (Signed)
  CARE MANAGEMENT ED NOTE 06/21/2014  Patient:  Jerry Brooks,Jerry Brooks   Account Number:  [id number]  Date Initiated:  06/21/2014  Documentation initiated by:  Edd ArbourGIBBS,KIMBERLY  Subjective/Objective Assessment:   23 [place] "[place]" Had tried Abreva without relief Informed he was told to speak with Cm about MATCH assistance     Subjective/Objective Assessment Detail:   Pt seen on 06/18/14 by [facility] staff and given a list of primary care resources to help with establishing a family doctor but confirms he has not obtained one  confirmed use od  [facility] [id number] - [place], [place] - [address] AT [facility] [address] [Patient Preferred]  [phone] fax [phone]  Cost of penciclovir listed from on ExcellentCoupons.begoodrx.com $[phone]     Action/Plan:   [facility] ED CM received a call transferred from ED US to CM mobile Cm spoke with pt CM reviewed EPIC information for d/c instructions [facility] Noted prescribed penciclovir by ED PA/NP R Albert   Action/Plan Detail:   Julianne [facility]Sp[facility] Doug [facility] who recommends changing pt to po vs cream for cost efficiency (acyclovir) Spoke with N Pisciotta, PA to assist with change of med to acyclovir 400 mg po tid for 7 days 21 tabs, no refills [facility]   Anticipated DC Date:  06/18/2014     Status Recommendation to Physician:   Result of Recommendation:    Other ED Services  Consult Working Plan    DC Planning Services  Other  Outpatient Services - Pt will follow up  PCP issues  [facility] / [facility] (established/new)  Medication Assistance    Choice offered to / List presented to:            Status of service:  Completed, signed off  ED Comments:   ED Comments Detail:  06/21/14 ED 1320 Cm spoke with pt to update him on the change of medication to acyclovir 400 mg po tid for 7 days with [facility] $16 or less. He confirms he will be able to obtain medication at  this cost vs the $100+. Denied need of MATCH assistance.  ED Cm discussed with pt the need to establish a relationship with a pcp in case he has another "cold sore" breakout so that he may be monitored and provided with medications for prevention or maintenance of medical concern. Discussed differences in emergency room providers and pcps.  Encouraged establishing care wiht a self pay pcp He voiced understanding. Reminded him he was seen by [facility] staff and given a list of pcps and this cm reviewed [facility], [facility] and [facility] fax confirmation of 2 goodrx coupons (1 for [phone], 1 for $11.31 for acyclovir [facility] received

## 2015-04-02 ENCOUNTER — Emergency Department (INDEPENDENT_AMBULATORY_CARE_PROVIDER_SITE_OTHER)
Admission: EM | Admit: 2015-04-02 | Discharge: 2015-04-02 | Disposition: A | Discharge status: Home / Self Care | Payer: Self-pay | Source: Home / Self Care | Attending: Emergency Medicine | Admitting: Emergency Medicine

## 2015-04-02 ENCOUNTER — Encounter (HOSPITAL_COMMUNITY): Payer: Self-pay | Admitting: Emergency Medicine

## 2015-04-02 DIAGNOSIS — B360 Pityriasis versicolor: Secondary | ICD-10-CM

## 2015-04-02 MED ORDER — KETOCONAZOLE 2 % EX SHAM: 1.0000 "application " | MEDICATED_SHAMPOO | Freq: Every day | CUTANEOUS | Status: AC

## 2015-04-02 NOTE — ED Notes (Signed)
C/o rash on arms x 1 month.  Mild irritation.  No otc treatment tried.

## 2015-04-02 NOTE — Discharge Instructions (Signed)
You have tinea versicolor. This is caused by fungus. Washed with the Nizoral shampoo daily for the next 1-2 weeks. Once the infection is treated, it will leave pale areas. This will improve over weeks to months. Follow-up as needed.

## 2015-04-02 NOTE — ED Provider Notes (Signed)
CSN: [id number]     Arrival date & time 04/02/15  1118 History   First MD Initiated Contact with Patient 04/02/15 1250     Chief Complaint  Patient presents with  . Rash   (Consider location/radiation/quality/duration/timing/severity/associated sxs/prior Treatment) HPI  He is a 24 year old man here for evaluation of rash. He states his started on his arms and a little bit on his stomach one month ago. It has gradually been spreading. It does not itch or hurt. He has not tried any creams. No new medications, lotions, detergents, foods. No recent illnesses.  History reviewed. No pertinent past medical history. Past Surgical History  Procedure Laterality Date  . Circumcision revision     Family History  Problem Relation Age of Onset  . Diabetes Other   . Hypertension Other    History  Substance Use Topics  . Smoking status: Current Every Day Smoker -- 0.50 packs/day    Types: Cigarettes  . Smokeless tobacco: Not on file  . Alcohol Use: 0.0 oz/week     Comment: occasional    Review of Systems As in history of present illness Allergies  Review of patient's allergies indicates no known allergies.  Home Medications   Prior to Admission medications   Medication Sig Start Date End Date Taking? Authorizing Provider  ketoconazole (NIZORAL) 2 % shampoo Apply 1 application topically daily. For 1 week. 04/02/15 04/09/15  Charm RingsErin J Axelle Szwed, MD  OVER THE COUNTER MEDICATION Take 1 application by mouth 2 (two) times daily.    Historical Provider, MD  penciclovir (DENAVIR) 1 % cream Apply 1 application topically every 2 (two) hours. 06/18/14   Robyn M Hess, PA-C   BP 124/86 mmHg  Pulse 91  Temp(Src) 97.8 F (36.6 C) (Oral)  Resp 16  SpO2 98% Physical Exam  Constitutional: He is oriented to person, place, and time. He appears well-developed and well-nourished. No distress.  Cardiovascular: Normal rate.   Pulmonary/Chest: Effort normal.  Neurological: He is alert and oriented to person, place,  and time.  Skin:  Well demarcated erythematous, slightly raised rash in [place] arms, stomach, chest, back. There are various sized confluent lesions. Minimal scale.    ED Course  Procedures (including critical care time) Labs Review Labs Reviewed - No data to display  Imaging Review No results found.   MDM   1. Tinea versicolor    We'll treat with ketoconazole shampoo. Discussed that skin is typically hypopigmented after resolution of infection. Follow-up as needed.    Charm RingsErin J Marrio Scribner, MD 04/02/15 1335

## 2015-12-05 ENCOUNTER — Emergency Department (HOSPITAL_COMMUNITY): Payer: Self-pay

## 2015-12-05 ENCOUNTER — Encounter (HOSPITAL_COMMUNITY): Payer: Self-pay | Admitting: Emergency Medicine

## 2015-12-05 ENCOUNTER — Emergency Department (HOSPITAL_COMMUNITY)
Admission: EM | Admit: 2015-12-05 | Discharge: 2015-12-05 | Disposition: A | Discharge status: Home / Self Care | Payer: Self-pay | Attending: Emergency Medicine | Admitting: Emergency Medicine

## 2015-12-05 DIAGNOSIS — R0789 Other chest pain: Secondary | ICD-10-CM | POA: Insufficient documentation

## 2015-12-05 DIAGNOSIS — F17210 Nicotine dependence, cigarettes, uncomplicated: Secondary | ICD-10-CM | POA: Insufficient documentation

## 2015-12-05 LAB — I-STAT TROPONIN, ED: TROPONIN I, POC: 0 ng/mL (ref 0.00–0.08)

## 2015-12-05 MED ORDER — NAPROXEN 500 MG PO TABS: 500.0000 mg | ORAL_TABLET | Freq: Two times a day (BID) | ORAL | Status: DC

## 2015-12-05 NOTE — ED Notes (Signed)
Nurse drawing labs. 

## 2015-12-05 NOTE — Discharge Instructions (Signed)
Follow up next week if not improving. °

## 2015-12-05 NOTE — ED Notes (Signed)
Pt. Declined IV at this time

## 2015-12-05 NOTE — ED Notes (Signed)
Delay in lab draw pt in exray 

## 2015-12-05 NOTE — Progress Notes (Signed)
EDCM spoke to patient at bedside. Patient confirms he [place] a pcp or [place].  [facility] provided [facility] with contact [facility]ation to [facility], informed patient of services there.  EDCM also provided patient with list of pcps who accept self pay patients, list of discount pharmacies and websites needymeds.org and GoodRX.com for medication assistance, phone number to inquire about the orange card, phone number to inquire about Mediciad, phone number to inquire about the Affordable Care Act, financial resources in the community such as local churches, salvation army, urban ministries, and dental assistance for uninsured patients.  Patient thankful for resources.  No further EDCM needs at this time.  Patient is agreeable to [facility] referral for orange card.  Patient reports he has filled out what he needs to for the orange card.  He reports a case manager from [facility] was supposed to contact him and never did.    [facility] referral made for the orange card.

## 2015-12-05 NOTE — ED Notes (Signed)
Pt c/o central chest pain that is intermittent when he takes deep breath that started last night.  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  Pt denies any cough

## 2015-12-05 NOTE — ED Provider Notes (Signed)
CSN: [id number]     Arrival date & time 12/05/15  1604 History   First MD Initiated Contact with Patient 12/05/15 1624     Chief Complaint  Patient presents with  . Chest Pain     (Consider location/radiation/quality/duration/timing/severity/associated sxs/prior Treatment) Patient is a 24 y.o. male presenting with chest pain. The history is provided by the patient (Patient states that he's been having right-sided chest pain worse with movement for a few days).  Chest Pain Pain location:  R chest Pain quality: aching   Pain radiates to:  Does not radiate Pain severity:  Moderate Onset quality:  Gradual Timing:  Intermittent Progression:  Waxing and waning Chronicity:  New Context: not breathing   Associated symptoms: no abdominal pain, no back pain, no cough, no fatigue and no headache     History reviewed. No pertinent past medical history. Past Surgical History  Procedure Laterality Date  . Circumcision revision     Family History  Problem Relation Age of Onset  . Diabetes Other   . Hypertension Other    Social History  Substance Use Topics  . Smoking status: Current Every Day Smoker -- 0.50 packs/day    Types: Cigarettes  . Smokeless tobacco: None  . Alcohol Use: 0.0 oz/week     Comment: occasional    Review of Systems  Constitutional: Negative for appetite change and fatigue.  HENT: Negative for congestion, ear discharge and sinus pressure.   Eyes: Negative for discharge.  Respiratory: Negative for cough.   Cardiovascular: Positive for chest pain.  Gastrointestinal: Negative for abdominal pain and diarrhea.  Genitourinary: Negative for frequency and hematuria.  Musculoskeletal: Negative for back pain.  Skin: Negative for rash.  Neurological: Negative for seizures and headaches.  Psychiatric/Behavioral: Negative for hallucinations.      Allergies  Review of patient's allergies indicates no known allergies.  Home Medications   Prior to Admission  medications   Medication Sig Start Date End Date Taking? Authorizing Provider  ibuprofen (ADVIL,MOTRIN) 600 MG tablet Take 600 mg by mouth every 6 (six) hours as needed for headache.   Yes Historical Provider, MD  naproxen (NAPROSYN) 500 MG tablet Take 1 tablet (500 mg total) by mouth 2 (two) times daily. 12/05/15   Bethann BerkshireJoseph Kshawn Canal, MD   BP 127/87 mmHg  Pulse 80  Temp(Src) 98.5 F (36.9 C) (Oral)  Resp 18  SpO2 100% Physical Exam  Constitutional: He is oriented to person, place, and time. He appears well-developed.  HENT:  Head: Normocephalic.  Eyes: Conjunctivae and EOM are normal. No scleral icterus.  Neck: Neck supple. No thyromegaly present.  Cardiovascular: Normal rate and regular rhythm.  Exam reveals no gallop and no friction rub.   No murmur heard. Pulmonary/Chest: No stridor. He has no wheezes. He has no rales. He exhibits no tenderness.  Abdominal: He exhibits no distension. There is no tenderness. There is no rebound.  Musculoskeletal: Normal range of motion. He exhibits no edema.  Lymphadenopathy:    He has no cervical adenopathy.  Neurological: He is oriented to person, place, and time. He exhibits normal muscle tone. Coordination normal.  Skin: No rash noted. No erythema.  Psychiatric: He has a normal mood and affect. His behavior is normal.    ED Course  Procedures (including critical care time) Labs Review Labs Reviewed  BASIC METABOLIC PANEL  CBC  I-STAT TROPOININ, ED    Imaging Review Dg Chest 2 View  12/05/2015  CLINICAL DATA:  Chest pain inspiration EXAM: CHEST - 2  VIEW COMPARISON:  07/26/2011 FINDINGS: The heart size and mediastinal contours are within normal limits. Both lungs are clear. The visualized skeletal structures are unremarkable. IMPRESSION: No active disease. Electronically Signed   By: Alcide Clever M.D.   On: 12/05/2015 16:26   I have personally reviewed and evaluated these images and lab results as part of my medical decision-making.   EKG  Interpretation   Date/Time:  Thursday December 05 2015 16:13:53 EST Ventricular Rate:  100 PR Interval:  134 QRS Duration: 110 QT Interval:  340 QTC Calculation: 438 R Axis:   67 Text Interpretation:  Sinus tachycardia Baseline wander in lead(s) II III  aVF Confirmed by Khya Halls  MD, Nevae Pinnix [phone]) on 12/05/2015 6:42:21 PM      MDM   Final diagnoses:  Chest wall pain    Labs EKG chest x-ray unremarkable. Will treat patient with Naprosyn for chest wall pain and follow-up PCP   Bethann Berkshire, MD 12/05/15 [phone]

## 2016-03-29 ENCOUNTER — Emergency Department (HOSPITAL_COMMUNITY)
Admission: EM | Admit: 2016-03-29 | Discharge: 2016-03-29 | Disposition: A | Discharge status: Home / Self Care | Payer: No Typology Code available for payment source | Attending: Emergency Medicine | Admitting: Emergency Medicine

## 2016-03-29 ENCOUNTER — Encounter (HOSPITAL_COMMUNITY): Payer: Self-pay | Admitting: *Deleted

## 2016-03-29 DIAGNOSIS — R2242 Localized swelling, mass and lump, left lower limb: Secondary | ICD-10-CM | POA: Insufficient documentation

## 2016-03-29 DIAGNOSIS — F17210 Nicotine dependence, cigarettes, uncomplicated: Secondary | ICD-10-CM | POA: Insufficient documentation

## 2016-03-29 DIAGNOSIS — L03119 Cellulitis of unspecified part of limb: Secondary | ICD-10-CM | POA: Insufficient documentation

## 2016-03-29 DIAGNOSIS — L539 Erythematous condition, unspecified: Secondary | ICD-10-CM | POA: Insufficient documentation

## 2016-03-29 MED ORDER — SULFAMETHOXAZOLE-TRIMETHOPRIM 800-160 MG PO TABS: 1.0000 | ORAL_TABLET | Freq: Two times a day (BID) | ORAL | Status: AC

## 2016-03-29 MED ORDER — SULFAMETHOXAZOLE-TRIMETHOPRIM 800-160 MG PO TABS
1.0000 | ORAL_TABLET | Freq: Once | ORAL | Status: AC
  Administered 2016-03-29: 1 via ORAL
  Filled 2016-03-29: qty 1

## 2016-03-29 NOTE — Discharge Instructions (Signed)
Mr. Jerry Brooks,  Nice meeting you! Please follow-up with your primary care provider. Return to the emergency department if you develop fevers, chills, increased redness, new/worsening symptoms. Feel better soon!  S. Lane HackerNicole Elayah Klooster, PA-C Cellulitis Cellulitis is an infection of the skin and the tissue beneath it. The infected area is usually red and tender. Cellulitis occurs most often in the arms and lower legs.  CAUSES  Cellulitis is caused by bacteria that enter the skin through cracks or cuts in the skin. The most common types of bacteria that cause cellulitis are staphylococci and streptococci. SIGNS AND SYMPTOMS   Redness and warmth.  Swelling.  Tenderness or pain.  Fever. DIAGNOSIS  Your health care provider can usually determine what is wrong based on a physical exam. Blood tests may also be done. TREATMENT  Treatment usually involves taking an antibiotic medicine. HOME CARE INSTRUCTIONS   Take your antibiotic medicine as directed by your health care provider. Finish the antibiotic even if you start to feel better.  Keep the infected arm or leg elevated to reduce swelling.  Apply a warm cloth to the affected area up to 4 times per day to relieve pain.  Take medicines only as directed by your health care provider.  Keep all follow-up visits as directed by your health care provider. SEEK MEDICAL CARE IF:   You notice red streaks coming from the infected area.  Your red area gets larger or turns dark in color.  Your bone or joint underneath the infected area becomes painful after the skin has healed.  Your infection returns in the same area or another area.  You notice a swollen bump in the infected area.  You develop new symptoms.  You have a fever. SEEK IMMEDIATE MEDICAL CARE IF:   You feel very sleepy.  You develop vomiting or diarrhea.  You have a general ill feeling (malaise) with muscle aches and pains.   This information is not intended to  replace advice given to you by your health care provider. Make sure you discuss any questions you have with your health care provider.   Document Released: 09/09/2005 Document Revised: 08/21/2015 Document Reviewed: 02/15/2012 Elsevier Interactive Patient Education [employer] Elsevier Inc.

## 2016-03-29 NOTE — ED Notes (Signed)
Pt reports insect bites to bila ankles which he first noticed Friday.  Redness and swelling noted.

## 2016-03-29 NOTE — ED Provider Notes (Signed)
CSN: [id number]     Arrival date & time 03/29/16  1514 History  By signing my name below, I, Jerry Brooks, attest that this documentation has been prepared under the direction and in the presence of Lane Hacker, PA-C. Electronically Signed: Linna Brooks, ED Scribe. 03/29/2016. 6:13 PM.   Chief Complaint  Patient presents with  . Insect Bite   The history is provided by the patient. No language interpreter was used.     HPI Comments: Jerry Brooks is a 25 y.o. male with no pertinent PMHx who presents to the Emergency Department with concern for insect bites to his bilateral ankles occurring 2 days ago. Pt notes associated swelling and redness of his inner right ankle and outer left ankle. He also endorses mild nausea beginning earlier today which has resolved. Pt reports that he went fishing two days ago and noticed his symptoms the following morning. He is not allergic to any bugs to his knowledge. Pt has not used new soaps, detergents, lotions, or socks recently. He denies any known allergies. He further denies SOB, vomiting, numbness, fever, chills, drainage, or any other associated symptoms. Pt notes that he was supposed to report to his parole officer today but needed to come to the hospital for his issues.  History reviewed. No pertinent past medical history. Past Surgical History  Procedure Laterality Date  . Circumcision revision     Family History  Problem Relation Age of Onset  . Diabetes Other   . Hypertension Other    Social History  Substance Use Topics  . Smoking status: Current Every Day Smoker -- 0.50 packs/day    Types: Cigarettes  . Smokeless tobacco: None  . Alcohol Use: 0.0 oz/week     Comment: occasional    Review of Systems  A complete 10 system review of systems was obtained and all systems are negative except as noted in the HPI and PMH.   Allergies  Review of patient's allergies indicates no known allergies.  Home Medications   Prior to  Admission medications   Medication Sig Start Date End Date Taking? Authorizing Provider  naproxen (NAPROSYN) 500 MG tablet Take 1 tablet (500 mg total) by mouth 2 (two) times daily. Patient not taking: Reported on 03/29/2016 12/05/15   Bethann Berkshire, MD   BP 120/78 mmHg  Pulse 80  Temp(Src) 98 F (36.7 C) (Oral)  Resp 18  SpO2 98%   Physical Exam  Constitutional: He is oriented to person, place, and time. He appears well-developed and well-nourished. No distress.  HENT:  Head: Normocephalic and atraumatic.  Eyes: Conjunctivae and EOM are normal.  Neck: Neck supple. No tracheal deviation present.  Cardiovascular: Normal rate.   Pulmonary/Chest: Effort normal. No respiratory distress.  Musculoskeletal: Normal range of motion.  Neurological: He is alert and oriented to person, place, and time.  Skin: Skin is warm and dry.  2x2 cm area of erythema on right medial malleolus 2x2 cm area of erythema on his left lateral malleolus No fluctuance No drainage  Psychiatric: He has a normal mood and affect. His behavior is normal.  Nursing note and vitals reviewed.   ED Course  Procedures   DIAGNOSTIC STUDIES: Oxygen Saturation is 100% on RA, normal by my interpretation.    COORDINATION OF CARE: 6:13 PM. Will discharge with Bactrim DS 800-160 mg tablet. Discussed treatment plan with pt at bedside and pt agreed to plan.    MDM   Final diagnoses:  Cellulitis of lower extremity, unspecified laterality  Pt is without risk factors for HIV; no recent use of steroids or other immunosuppressive medications; no Hx of diabetes.  Pt is without gross abscess for which I&D would be possible. Pt encouraged to return if redness begins to streak, and/or fever or nausea/vomiting develop.  Pt is alert, oriented, NAD, afebrile, non tachycardic, nonseptic and nontoxic appearing.  Pt to be d/c on oral antibiotics with strict f/u instructions.     Melton KrebsSamantha Nicole Subrina Vecchiarelli, PA-C 04/03/16 2258  Derwood KaplanAnkit  Nanavati, MD 04/04/16 [phone]

## 2016-03-29 NOTE — Progress Notes (Addendum)
Pt was in the woods fishing on Friday and now has two reddened areas on both ankles that itch. Neither area is warm to the touch. Pt requested a sprite. PA in with the pt. (6:10pm )

## 2016-09-22 ENCOUNTER — Emergency Department (HOSPITAL_COMMUNITY)
Admission: EM | Admit: 2016-09-22 | Discharge: 2016-09-22 | Disposition: A | Discharge status: Home / Self Care | Payer: Self-pay | Attending: Emergency Medicine | Admitting: Emergency Medicine

## 2016-09-22 ENCOUNTER — Encounter (HOSPITAL_COMMUNITY): Payer: Self-pay

## 2016-09-22 DIAGNOSIS — F17210 Nicotine dependence, cigarettes, uncomplicated: Secondary | ICD-10-CM | POA: Insufficient documentation

## 2016-09-22 DIAGNOSIS — Z791 Long term (current) use of non-steroidal anti-inflammatories (NSAID): Secondary | ICD-10-CM | POA: Insufficient documentation

## 2016-09-22 DIAGNOSIS — L989 Disorder of the skin and subcutaneous tissue, unspecified: Secondary | ICD-10-CM | POA: Insufficient documentation

## 2016-09-22 NOTE — Discharge Instructions (Signed)
Follow-up with primary care doctor if the lesion does not resolve in the next week or so. Monitor for additional skin lesions.

## 2016-09-22 NOTE — ED Notes (Signed)
Discharge instructions and follow up care reviewed with patient. Patient verbalized understanding. 

## 2016-09-22 NOTE — ED Provider Notes (Signed)
[facility]-EMERGENCY DEPT Provider Note   CSN: [id number] Arrival date & time: 09/22/16  0747     History   Chief Complaint Chief Complaint  Patient presents with  . Bump on Genitals    HPI Jerry Brooks is a 25 y.o. male.  Pt noticed a small bump on the shaft of his penis the other day.  It looked like a small pimple.  This am when he woke up it was larger and red.  He denies any penile drainage.  No other lesions.  No new sexual contacts.  No known std exposures.  No dysuria.  No fevers.   The history is provided by the patient.    History reviewed. No pertinent past medical history.  There are no active problems to display for this patient.   Past Surgical History:  Procedure Laterality Date  . CIRCUMCISION REVISION         Home Medications    Prior to Admission medications   Medication Sig Start Date End Date Taking? Authorizing Provider  naproxen (NAPROSYN) 500 MG tablet Take 1 tablet (500 mg total) by mouth 2 (two) times daily. Patient not taking: Reported on 03/29/2016 12/05/15   Bethann BerkshireJoseph Zammit, MD    Family History Family History  Problem Relation Age of Onset  . Diabetes Other   . Hypertension Other     Social History Social History  Substance Use Topics  . Smoking status: Current Every Day Smoker    Packs/day: 0.50    Types: Cigarettes  . Smokeless tobacco: Never Used  . Alcohol use 0.0 oz/week     Comment: occasional     Allergies   Review of patient's allergies indicates no known allergies.   Review of Systems Review of Systems  All other systems reviewed and are negative.    Physical Exam Updated Vital Signs BP 155/100 (BP Location: Left Arm)   Pulse 65   Temp 97.6 F (36.4 C) (Oral)   Resp 18   Ht 5\' 9"  (1.753 m)   Wt 83.9 kg   SpO2 99%   BMI 27.32 kg/m   Physical Exam  Constitutional: He appears well-developed and well-nourished. No distress.  HENT:  Head: Normocephalic and atraumatic.  Right Ear: External ear  normal.  Left Ear: External ear normal.  Eyes: Conjunctivae are normal. Right eye exhibits no discharge. Left eye exhibits no discharge. No scleral icterus.  Neck: Neck supple. No tracheal deviation present.  Cardiovascular: Normal rate.   Pulmonary/Chest: Effort normal. No stridor. No respiratory distress.  Abdominal: He exhibits no distension.  Genitourinary:  Genitourinary Comments: Small 3 mm papule with area of erythema surrounding it overlying some redundant tissue on the penile shaft, no penile discharge, no other lesions  Musculoskeletal: He exhibits no edema.  Neurological: He is alert. Cranial nerve deficit: no gross deficits.  Skin: Skin is warm and dry. No rash noted.  Psychiatric: He has a normal mood and affect.  Nursing note and vitals reviewed.    ED Treatments / Results  Labs (all labs ordered are listed, but only abnormal results are displayed) Labs Reviewed  RPR  HIV ANTIBODY (ROUTINE TESTING)  GC/CHLAMYDIA PROBE AMP ([place]) NOT AT [facility]    Procedures Procedures (including critical care time)    Initial Impression / Assessment and Plan / ED Course  I have reviewed the triage vital signs and the nursing notes.  Pertinent labs & imaging results that were available during my care of the patient were reviewed by me  and considered in my medical decision making (see chart for details).  Clinical Course    Lesions not suggestive of a wart. It does not appear to be a vesicle.  I doubt an STD. It could be an area of skin irritation because of the redundant tissue. We'll send off gonorrhea and Chlamydia syphilis and HIV. Follow-up with a primary care doctor.  Final Clinical Impressions(s) / ED Diagnoses   Final diagnoses:  Skin lesions    New Prescriptions New Prescriptions   No medications on file     Linwood Dibbles, MD 09/22/16 [phone]

## 2016-09-22 NOTE — ED Triage Notes (Signed)
Pt presents with c/o bump on his genitals that he noticed yesterday. Pt denies any drainage to the area, reports that he has not been exposed to any STD's.

## 2016-09-23 LAB — RPR: RPR Ser Ql: NONREACTIVE

## 2016-09-23 LAB — GC/CHLAMYDIA PROBE AMP (~~LOC~~) NOT AT ARMC
CHLAMYDIA, DNA PROBE: NEGATIVE
NEISSERIA GONORRHEA: NEGATIVE

## 2016-09-23 LAB — HIV ANTIBODY (ROUTINE TESTING W REFLEX): HIV SCREEN 4TH GENERATION: NONREACTIVE

## 2017-03-02 ENCOUNTER — Encounter (HOSPITAL_COMMUNITY): Payer: Self-pay | Admitting: Family Medicine

## 2017-03-02 ENCOUNTER — Ambulatory Visit (HOSPITAL_COMMUNITY)
Admission: EM | Admit: 2017-03-02 | Discharge: 2017-03-02 | Disposition: A | Discharge status: Home / Self Care | Payer: Self-pay | Attending: Family Medicine | Admitting: Family Medicine

## 2017-03-02 ENCOUNTER — Emergency Department (HOSPITAL_COMMUNITY): Admission: EM | Admit: 2017-03-02 | Payer: No Typology Code available for payment source | Source: Home / Self Care

## 2017-03-02 DIAGNOSIS — L03221 Cellulitis of neck: Secondary | ICD-10-CM

## 2017-03-02 MED ORDER — DOXYCYCLINE HYCLATE 100 MG PO CAPS: 100.0000 mg | ORAL_CAPSULE | Freq: Two times a day (BID) | ORAL | 0 refills | Status: DC

## 2017-03-02 NOTE — ED Triage Notes (Signed)
Pt here for abscess to right ear/neck area. sts started smal and then he squeezed it and now its spreading.

## 2017-03-02 NOTE — ED Provider Notes (Signed)
[facility]    CSN: [id number] Arrival date & time: 03/02/17  1835     History   Chief Complaint Chief Complaint  Patient presents with  . Abscess    HPI Jerry Brooks is a 26 y.o. male.   HPI Patient has a history of a cyst on his right ear. He squeezes today and some white substance came out. Since then, he is now some redness, warmth, and mild pain over his neck. He is not having any fevers. He does have a history of MRSA.  History reviewed. No pertinent past medical history.   Past Surgical History:  Procedure Laterality Date  . CIRCUMCISION REVISION       Home Medications   Takes no medications routinely.  Family History Family History  Problem Relation Age of Onset  . Diabetes Other   . Hypertension Other     Social History Social History  Substance Use Topics  . Smoking status: Current Every Day Smoker    Packs/day: 0.50    Types: Cigarettes  . Smokeless tobacco: Never Used  . Alcohol use 0.0 oz/week     Comment: occasional     Allergies   Patient has no known allergies.   Review of Systems Review of Systems  Constitutional: Negative for fever.  Skin: As noted in HPI   Physical Exam Triage Vital Signs ED Triage Vitals [03/02/17 1853]  Enc Vitals Group     BP 119/82     Pulse Rate (!) 106     Resp 18     Temp 98.4 F (36.9 C)     SpO2 99 %     Pain Score 5  ated Vital Signs BP 119/82   Pulse (!) 106   Temp 98.4 F (36.9 C)   Resp 18   SpO2 99%   Physical Exam  Constitutional: He is oriented to person, place, and time. He appears well-developed and well-nourished.  Neck: Normal range of motion. Neck supple.  Cardiovascular: Normal rate and regular rhythm.   Pulmonary/Chest: Effort normal and breath sounds normal. No respiratory distress.  Neurological: He is alert and oriented to person, place, and time.  Psychiatric: He has a normal mood and affect. Judgment normal.  Skin: Erythema and warmth over R  lateral neck, just posterior and inferior to ear, there is a cystic, spherical structure in R ear lobe, minimal TTP, no fluctuance  UC Treatments / Results  Procedures Procedures - none  Initial Impression / Assessment and Plan / UC Course  I have reviewed the triage vital signs and the nursing notes.  Pertinent labs & imaging results that were available during my care of the patient were reviewed by me and considered in my medical decision making (see chart for details).     26 yo male presents with signs and symptoms consistent with cellulitis. I do not believe the cyst on his right earlobe is infected. He does have a history of MRSA, will treat with Doxy. If symptoms worsen or fail to improve, seek immediate care. Otherwise follow-up with PCP if symptoms fail to improve. The patient voiced understanding and agreement to the plan.  Final Clinical Impressions(s) / UC Diagnoses   Final diagnoses:  Cellulitis of neck    New Prescriptions Discharge Medication List as of 03/02/2017  7:19 PM    START taking these medications   Details  doxycycline (VIBRAMYCIN) 100 MG capsule Take 1 capsule (100 mg total) by mouth 2 (two) times daily., Starting Tue  03/02/2017, Normal         Jilda Roche [place], [place] 03/02/17 2117

## 2018-02-14 ENCOUNTER — Encounter (HOSPITAL_COMMUNITY): Payer: Self-pay

## 2018-02-14 ENCOUNTER — Other Ambulatory Visit: Payer: Self-pay

## 2018-02-14 ENCOUNTER — Emergency Department (HOSPITAL_COMMUNITY): Payer: No Typology Code available for payment source

## 2018-02-14 ENCOUNTER — Emergency Department (HOSPITAL_COMMUNITY)
Admission: EM | Admit: 2018-02-14 | Discharge: 2018-02-14 | Disposition: A | Discharge status: Home / Self Care | Payer: No Typology Code available for payment source | Attending: Emergency Medicine | Admitting: Emergency Medicine

## 2018-02-14 DIAGNOSIS — S199XXA Unspecified injury of neck, initial encounter: Secondary | ICD-10-CM | POA: Diagnosis present

## 2018-02-14 DIAGNOSIS — Y999 Unspecified external cause status: Secondary | ICD-10-CM | POA: Insufficient documentation

## 2018-02-14 DIAGNOSIS — Z87891 Personal history of nicotine dependence: Secondary | ICD-10-CM | POA: Diagnosis not present

## 2018-02-14 DIAGNOSIS — Y929 Unspecified place or not applicable: Secondary | ICD-10-CM | POA: Insufficient documentation

## 2018-02-14 DIAGNOSIS — Y939 Activity, unspecified: Secondary | ICD-10-CM | POA: Insufficient documentation

## 2018-02-14 DIAGNOSIS — S161XXA Strain of muscle, fascia and tendon at neck level, initial encounter: Secondary | ICD-10-CM | POA: Diagnosis not present

## 2018-02-14 MED ORDER — ACETAMINOPHEN 325 MG PO TABS
650.0000 mg | ORAL_TABLET | Freq: Once | ORAL | Status: AC
  Administered 2018-02-14: 650 mg via ORAL
  Filled 2018-02-14: qty 2

## 2018-02-14 MED ORDER — CYCLOBENZAPRINE HCL 10 MG PO TABS: 10.0000 mg | ORAL_TABLET | Freq: Two times a day (BID) | ORAL | 0 refills | Status: DC | PRN

## 2018-02-14 MED ORDER — IBUPROFEN 800 MG PO TABS
800.0000 mg | ORAL_TABLET | Freq: Three times a day (TID) | ORAL | 0 refills | Status: DC
Start: 2018-02-14 — End: 2021-02-19
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
## 2018-08-17 ENCOUNTER — Other Ambulatory Visit: Payer: Self-pay

## 2018-08-17 ENCOUNTER — Encounter (HOSPITAL_COMMUNITY): Payer: Self-pay

## 2018-08-17 ENCOUNTER — Emergency Department (HOSPITAL_COMMUNITY)
Admission: EM | Admit: 2018-08-17 | Discharge: 2018-08-17 | Disposition: A | Discharge status: Home / Self Care | Payer: Self-pay | Attending: Emergency Medicine | Admitting: Emergency Medicine

## 2018-08-17 DIAGNOSIS — Z87891 Personal history of nicotine dependence: Secondary | ICD-10-CM | POA: Insufficient documentation

## 2018-08-17 DIAGNOSIS — K0889 Other specified disorders of teeth and supporting structures: Secondary | ICD-10-CM | POA: Insufficient documentation

## 2018-08-17 MED ORDER — PENICILLIN V POTASSIUM 500 MG PO TABS: 500.0000 mg | ORAL_TABLET | Freq: Four times a day (QID) | ORAL | 0 refills | Status: AC

## 2018-08-17 NOTE — ED Triage Notes (Signed)
Pt states he's had a toothache for several days. Pt states it is on the bottom left of his mouth,.

## 2018-08-17 NOTE — ED Provider Notes (Signed)
[facility]-EMERGENCY DEPT Provider Note   CSN: [id number] Arrival date & time: 08/17/18  1140     History   Chief Complaint Chief Complaint  Patient presents with  . Dental Pain    HPI Jerry Brooks is a 27 y.o. male.  HPI   Pt is a 27 y/o male who presents to the ED today for evaluation of left lower dental pain that began about 3 days ago. State pain is constant and severe in nature. He tried taking motrin with no significant relief. He has taken penicillin about 1 year ago when he had similar sxs and this resolved his pain. Denies fevers, sore throat, uri sxs, difficulty swallowing or tolerating po.   History reviewed. No pertinent past medical history.  There are no active problems to display for this patient.   Past Surgical History:  Procedure Laterality Date  . CIRCUMCISION REVISION          Home Medications    Prior to Admission medications   Medication Sig Start Date End Date Taking? Authorizing Provider  cyclobenzaprine (FLEXERIL) 10 MG tablet Take 1 tablet (10 mg total) by mouth 2 (two) times daily as needed for muscle spasms. 02/14/18   McDonald, Mia A, PA-C  doxycycline (VIBRAMYCIN) 100 MG capsule Take 1 capsule (100 mg total) by mouth 2 (two) times daily. 03/02/17   Sharlene Dory, DO  ibuprofen (ADVIL,MOTRIN) 800 MG tablet Take 1 tablet (800 mg total) by mouth 3 (three) times daily. 02/14/18   McDonald, Mia A, PA-C  penicillin v potassium (VEETID) 500 MG tablet Take 1 tablet (500 mg total) by mouth 4 (four) times daily for 7 days. 08/17/18 08/24/18  Palmyra Rogacki S, PA-C    Family History Family History  Problem Relation Age of Onset  . Diabetes Other   . Hypertension Other     Social History Social History   Tobacco Use  . Smoking status: Former Smoker    Packs/day: 0.50    Types: Cigarettes  . Smokeless tobacco: Never Used  Substance Use Topics  . Alcohol use: Yes    Comment: occasional  . Drug use: Yes   Types: Marijuana     Allergies   Patient has no known allergies.   Review of Systems Review of Systems  Constitutional: Negative for chills and fever.  HENT: Positive for dental problem. Negative for congestion, facial swelling, rhinorrhea and sore throat.   Gastrointestinal: Negative for nausea and vomiting.   Physical Exam Updated Vital Signs BP (!) 136/96   Pulse 89   Temp 98.2 F (36.8 C) (Oral)   Resp 15   Ht 5\' 11"  (1.803 m)   Wt 88.3 kg   SpO2 100%   BMI 27.14 kg/m   Physical Exam  Constitutional: He is oriented to person, place, and time. He appears well-developed and well-nourished. No distress.  HENT:  Right Ear: External ear normal.  Left Ear: External ear normal.  Mouth/Throat: Oropharynx is clear and moist.  No pharyngeal erythema, no tonsillar swelling or exudates. Tooth #18 is fractured and tender to percussion. No obvious fluid collections or dental abscesses. No swelling beneath the tongue. No trismus.   Eyes: Conjunctivae are normal.  Neck: Normal range of motion. Neck supple.  No swelling beneath the neck  Cardiovascular: Normal rate and regular rhythm.  Pulmonary/Chest: Effort normal and breath sounds normal.  Lymphadenopathy:    He has no cervical adenopathy.  Neurological: He is alert and oriented to person, place, and  time.  Skin: Skin is warm and dry.   ED Treatments / Results  Labs (all labs ordered are listed, but only abnormal results are displayed) Labs Reviewed - No data to display  EKG None  Radiology No results found.  Procedures Procedures (including critical care time)  Medications Ordered in ED Medications - No data to display   Initial Impression / Assessment and Plan / ED Course  I have reviewed the triage vital signs and the nursing notes.  Pertinent labs & imaging results that were available during my care of the patient were reviewed by me and considered in my medical decision making (see chart for details).       Final Clinical Impressions(s) / ED Diagnoses   Final diagnoses:  Pain, dental   Patient with toothache.  No gross abscess.  Exam unconcerning for Ludwig's angina or spread of infection.  Will treat with penicillin and pain medicine.  Urged patient to follow-up with dentist.    ED Discharge Orders         Ordered    penicillin v potassium (VEETID) 500 MG tablet  4 times daily     08/17/18 1319           Edmund Holcomb S, PA-C 08/17/18 1320    Tegeler, Canary Brim, MD 08/17/18 [phone]

## 2018-08-17 NOTE — Discharge Instructions (Addendum)
You were given a prescription for antibiotics. Please take the antibiotic prescription fully.   Please follow up with your primary care provider within 5-7 days for re-evaluation of your symptoms. If you do not have a primary care provider, information for a healthcare clinic has been provided for you to make arrangements for follow up care. Please return to the emergency department for any new or worsening symptoms.  Please follow up with a dentist. Resources have been provided.
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
## 2020-11-02 ENCOUNTER — Encounter (HOSPITAL_COMMUNITY): Payer: Self-pay | Admitting: Emergency Medicine

## 2020-11-02 ENCOUNTER — Emergency Department (HOSPITAL_COMMUNITY)
Admission: EM | Admit: 2020-11-02 | Discharge: 2020-11-02 | Disposition: A | Discharge status: Home / Self Care | Payer: Self-pay | Attending: Emergency Medicine | Admitting: Emergency Medicine

## 2020-11-02 ENCOUNTER — Other Ambulatory Visit: Payer: Self-pay

## 2020-11-02 DIAGNOSIS — Z87891 Personal history of nicotine dependence: Secondary | ICD-10-CM | POA: Insufficient documentation

## 2020-11-02 DIAGNOSIS — R000 Tachycardia, unspecified: Secondary | ICD-10-CM | POA: Insufficient documentation

## 2020-11-02 DIAGNOSIS — K625 Hemorrhage of anus and rectum: Secondary | ICD-10-CM | POA: Insufficient documentation

## 2020-11-02 LAB — TYPE AND SCREEN
ABO/RH(D): O POS
Antibody Screen: NEGATIVE

## 2020-11-02 LAB — COMPREHENSIVE METABOLIC PANEL
ALT: 96 U/L — ABNORMAL HIGH (ref 0–44)
AST: 79 U/L — ABNORMAL HIGH (ref 15–41)
Albumin: 4.9 g/dL (ref 3.5–5.0)
Alkaline Phosphatase: 103 U/L (ref 38–126)
Anion gap: 9 (ref 5–15)
BUN: 12 mg/dL (ref 6–20)
CO2: 24 mmol/L (ref 22–32)
Calcium: 9.3 mg/dL (ref 8.9–10.3)
Chloride: 101 mmol/L (ref 98–111)
Creatinine, Ser: 0.82 mg/dL (ref 0.61–1.24)
GFR, Estimated: >60 mL/min (ref >60)
Glucose, Bld: 111 mg/dL — ABNORMAL HIGH (ref 70–99)
Potassium: 4 mmol/L (ref 3.5–5.1)
Sodium: 134 mmol/L — ABNORMAL LOW (ref 135–145)
Total Bilirubin: 0.4 mg/dL (ref 0.3–1.2)
Total Protein: 9 g/dL — ABNORMAL HIGH (ref 6.5–8.1)

## 2020-11-02 LAB — CBC
HCT: 49.6 % (ref 39.0–52.0)
Hemoglobin: 17.2 g/dL — ABNORMAL HIGH (ref 13.0–17.0)
MCH: 32.6 pg (ref 26.0–34.0)
MCHC: 34.7 g/dL (ref 30.0–36.0)
MCV: 93.9 fL (ref 80.0–100.0)
Platelets: 272 10*3/uL (ref 150–400)
RBC: 5.28 MIL/uL (ref 4.22–5.81)
RDW: 12.4 % (ref 11.5–15.5)
WBC: 9.4 10*3/uL (ref 4.0–10.5)
nRBC: 0 % (ref 0.0–0.2)

## 2020-11-02 LAB — POC OCCULT BLOOD, ED: Fecal Occult Bld: POSITIVE — AB

## 2020-11-02 NOTE — Discharge Instructions (Addendum)
You have been evaluated for your rectal bleeding.  This is likely due to hemorrhoid.  However, you would need to call and follow-up closely with gastroenterologist for further evaluation.  You may benefit from a colonoscopy to identify the source of the bleeding.  Please avoid heavy alcohol use as it can be harmful for your health.  Return to the ED if you have any concern.

## 2020-11-02 NOTE — ED Notes (Signed)
Patient ambulatory to restroom without difficulty. 

## 2020-11-02 NOTE — ED Provider Notes (Signed)
[facility]-EMERGENCY DEPT Provider Note   CSN: [id number] Arrival date & time: 11/02/20  1002     History Chief Complaint  Patient presents with  . Rectal Bleeding    Jerry Brooks is a 29 y.o. male.  The history is provided by the patient. No language interpreter was used.  Rectal Bleeding    29 year old male presenting for evaluation of rectal bleeding.  Patient report 3 days ago when having a bowel movement he noticed bright red blood per rectum.  No associated pain, stools was loose.  The next day he was fine but today after having a bowel movement he noticed blood again which concerns him.  He does not complain of any lightheadedness or dizziness no abdominal pain no rectal pain no rectal itchiness no constipation or hard stool.  He has never had this problem before.  He does admits to heavy alcohol use and tobacco use.  He does not complain of any nausea or vomiting.  No recent rectal injury.  History reviewed. No pertinent past medical history.  There are no problems to display for this patient.   Past Surgical History:  Procedure Laterality Date  . CIRCUMCISION REVISION         Family History  Problem Relation Age of Onset  . Diabetes Other   . Hypertension Other     Social History   Tobacco Use  . Smoking status: Former Smoker    Packs/day: 0.50    Types: Cigarettes  . Smokeless tobacco: Never Used  Vaping Use  . Vaping Use: Never used  Substance Use Topics  . Alcohol use: Yes    Comment: occasional  . Drug use: Yes    Types: Marijuana    Home Medications Prior to Admission medications   Medication Sig Start Date End Date Taking? Authorizing Provider  cyclobenzaprine (FLEXERIL) 10 MG tablet Take 1 tablet (10 mg total) by mouth 2 (two) times daily as needed for muscle spasms. 02/14/18   McDonald, Mia A, PA-C  doxycycline (VIBRAMYCIN) 100 MG capsule Take 1 capsule (100 mg total) by mouth 2 (two) times daily. 03/02/17    Sharlene Dory, DO  ibuprofen (ADVIL,MOTRIN) 800 MG tablet Take 1 tablet (800 mg total) by mouth 3 (three) times daily. 02/14/18   McDonald, Mia A, PA-C    Allergies    Patient has no known allergies.  Review of Systems   Review of Systems  Gastrointestinal: Positive for hematochezia.  All other systems reviewed and are negative.   Physical Exam Updated Vital Signs BP (!) 138/102   Pulse (!) 110   Temp 98.1 F (36.7 C) (Oral)   Resp 18   Ht 5\' 11"  (1.803 m)   Wt 90.7 kg   SpO2 97%   BMI 27.89 kg/m   Physical Exam Vitals and nursing note reviewed.  Constitutional:      General: He is not in acute distress.    Appearance: He is well-developed.  HENT:     Head: Atraumatic.  Eyes:     Conjunctiva/sclera: Conjunctivae normal.  Cardiovascular:     Rate and Rhythm: Tachycardia present.     Pulses: Normal pulses.     Heart sounds: Normal heart sounds.  Pulmonary:     Effort: Pulmonary effort is normal.     Breath sounds: Normal breath sounds.  Abdominal:     Palpations: Abdomen is soft.     Tenderness: There is no abdominal tenderness.  Genitourinary:    Comments ,  RN available to chaperone.  Normal rectal tone no obvious mass normal color stool on glove no anal fissure and no thrombosed hemorrhoid noted. Musculoskeletal:     Cervical back: Neck supple.  Skin:    Findings: No rash.  Neurological:     Mental Status: He is alert. Mental status is at baseline.  Psychiatric:        Mood and Affect: Mood normal.     ED Results / Procedures / Treatments   Labs (all labs ordered are listed, but only abnormal results are displayed) Labs Reviewed  COMPREHENSIVE METABOLIC PANEL - Abnormal; Notable for the following components:      Result Value   Sodium 134 (*)    Glucose, Bld 111 (*)    Total Protein 9.0 (*)    AST 79 (*)    ALT 96 (*)    All other components within normal limits  CBC - Abnormal; Notable for the following components:   Hemoglobin  17.2 (*)    All other components within normal limits  POC OCCULT BLOOD, ED - Abnormal; Notable for the following components:   Fecal Occult Bld POSITIVE (*)    All other components within normal limits  TYPE AND SCREEN    EKG None  Radiology No results found.  Procedures Procedures (including critical care time)  Medications Ordered in ED Medications - No data to display  ED Course  I have reviewed the triage vital signs and the nursing notes.  Pertinent labs & imaging results that were available during my care of the patient were reviewed by me and considered in my medical decision making (see chart for details).    MDM Rules/Calculators/A&P                          BP (!) 132/98   Pulse (!) 106   Temp 98.1 F (36.7 C) (Oral)   Resp 19   Ht 5\' 11"  (1.803 m)   Wt 90.7 kg   SpO2 100%   BMI 27.89 kg/m   Final Clinical Impression(s) / ED Diagnoses Final diagnoses:  Rectal bleeding    Rx / DC Orders ED Discharge Orders    None     3:29 PM Patient here with rectal bleeding for the past 2 days.  Two episodes without any significant systemic complaint.  Rectal exam unremarkable.  No frank bleeding noted.  Labs shows mild transaminitis with AST 79, ALT 96.  Patient does administer heavy alcohol use which I recommend alcohol cessation.  Hemoglobin is hemoconcentrated at 17.2.  Vital sign fairly normal mild tachycardia likely environmental induced.  4:10 PM Hemoccult positive however at this time patient is stable to follow-up closely with GI for evaluation of his rectal bleeding.  He would benefit from colonoscopy.  Return precaution discussed   , Fayrene Helper 11/02/20 1613    11/04/20, MD 11/02/20 2152

## 2020-11-02 NOTE — ED Triage Notes (Signed)
Pt states he has noticed blood in his stool x 3 since Tuesday. Denies other symptoms. Alert and oriented.

## 2020-11-06 ENCOUNTER — Encounter: Payer: Self-pay | Admitting: Physician Assistant

## 2020-11-21 ENCOUNTER — Ambulatory Visit: Payer: Self-pay | Admitting: Physician Assistant

## 2020-12-17 ENCOUNTER — Ambulatory Visit: Payer: Self-pay | Admitting: Nurse Practitioner

## 2020-12-17 NOTE — Progress Notes (Deleted)
12/17/2020 Jerry Brooks [id number] Apr 14, 1991   CHIEF COMPLAINT:   HISTORY OF PRESENT ILLNESS: Jerry Brooks is a 30 year old male with a past medical history of cellulitis   Presented to [facility] ED on 11/02/2020 due to having rectal bleeding x 3 days.  At that time, he reported passing loose stools without associated abdominal or rectal pain.  Reported heavy alcohol and tobacco use.  On exam by the ED physician there is no evidence of a thrombosed hemorrhoid or anal fissure.  FOBT was positive.  Hemoglobin 17.2.  His LFTs were elevated with AST 79 and ALT 96.  Normal total bili and alk phos levels.  SARS coronavirus 2 testing was not done. A GI evaluation including a colonoscopy was recommended.  CBC Latest Ref Rng & Units 11/02/2020 07/26/2011  WBC 4.0 - 10.5 K/uL 9.4 7.3  Hemoglobin 13.0 - 17.0 g/dL 17.2(H) 15.8  Hematocrit 39.0 - 52.0 % 49.6 44.1  Platelets 150 - 400 K/uL 272 214     CMP Latest Ref Rng & Units 11/02/2020 07/26/2011  Glucose 70 - 99 mg/dL 111(H) 94  BUN 6 - 20 mg/dL 12 7  Creatinine 0.61 - 1.24 mg/dL 0.82 0.80  Sodium 135 - 145 mmol/L 134(L) 139  Potassium 3.5 - 5.1 mmol/L 4.0 3.3(L)  Chloride 98 - 111 mmol/L 101 101  CO2 22 - 32 mmol/L 24 26  Calcium 8.9 - 10.3 mg/dL 9.3 9.3  Total Protein 6.5 - 8.1 g/dL 9.0(H) -  Total Bilirubin 0.3 - 1.2 mg/dL 0.4 -  Alkaline Phos 38 - 126 U/L 103 -  AST 15 - 41 U/L 79(H) -  ALT 0 - 44 U/L 96(H) -      Past Surgical History:  Procedure Laterality Date  . CIRCUMCISION REVISION     Social History:  Family History:   reports that he has quit smoking. His smoking use included cigarettes. He smoked 0.50 packs per day. He has never used smokeless tobacco. He reports current alcohol use. He reports current drug use. Drug: Marijuana. family history includes Diabetes in an other family member; Hypertension in an other family member. No Known Allergies    Outpatient Encounter Medications  as of 12/17/2020  Medication Sig  . cyclobenzaprine (FLEXERIL) 10 MG tablet Take 1 tablet (10 mg total) by mouth 2 (two) times daily as needed for muscle spasms.  Marland Kitchen doxycycline (VIBRAMYCIN) 100 MG capsule Take 1 capsule (100 mg total) by mouth 2 (two) times daily.  Marland Kitchen ibuprofen (ADVIL,MOTRIN) 800 MG tablet Take 1 tablet (800 mg total) by mouth 3 (three) times daily.   No facility-administered encounter medications on file as of 12/17/2020.     REVIEW OF SYSTEMS: All other systems reviewed and negative except where noted in the History of Present Illness.  Gen: Denies fever, sweats or chills. No weight loss.  CV: Denies chest pain, palpitations or edema. Resp: Denies cough, shortness of breath of hemoptysis.  GI: Denies heartburn, dysphagia, stomach or lower abdominal pain. No diarrhea or constipation.  GU : Denies urinary burning, blood in urine, increased urinary frequency or incontinence. MS: Denies joint pain, muscles aches or weakness. Derm: Denies rash, itchiness, skin lesions or unhealing ulcers. Psych: Denies depression, anxiety, memory loss, suicidal ideation and confusion. Heme: Denies bruising, bleeding. Neuro:  Denies headaches, dizziness or paresthesias. Endo:  Denies any problems with DM, thyroid or adrenal function.    PHYSICAL EXAM: There were no vitals taken for this visit. General: Well developed .Marland KitchenMarland Kitchen  in no acute distress. Head: Normocephalic and atraumatic. Eyes:  Sclerae non-icteric, conjunctive pink. Ears: Normal auditory acuity. Mouth: Dentition intact. No ulcers or lesions.  Neck: Supple, no lymphadenopathy or thyromegaly.  Lungs: Clear bilaterally to auscultation without wheezes, crackles or rhonchi. Heart: Regular rate and rhythm. No murmur, rub or gallop appreciated.  Abdomen: Soft, nontender, non distended. No masses. No hepatosplenomegaly. Normoactive bowel sounds x 4 quadrants.  Rectal:  Musculoskeletal: Symmetrical with no gross deformities. Skin: Warm  and dry. No rash or lesions on visible extremities. Extremities: No edema. Neurological: Alert oriented x 4, no focal deficits.  Psychological:  Alert and cooperative. Normal mood and affect.  ASSESSMENT AND PLAN:    CC:  No ref. provider found

## 2021-02-19 ENCOUNTER — Encounter (HOSPITAL_COMMUNITY): Payer: Self-pay

## 2021-02-19 ENCOUNTER — Ambulatory Visit (INDEPENDENT_AMBULATORY_CARE_PROVIDER_SITE_OTHER): Payer: Self-pay

## 2021-02-19 ENCOUNTER — Ambulatory Visit (HOSPITAL_COMMUNITY)
Admission: EM | Admit: 2021-02-19 | Discharge: 2021-02-19 | Disposition: A | Discharge status: Home / Self Care | Payer: Self-pay | Attending: Family Medicine | Admitting: Family Medicine

## 2021-02-19 ENCOUNTER — Other Ambulatory Visit: Payer: Self-pay

## 2021-02-19 DIAGNOSIS — M79642 Pain in left hand: Secondary | ICD-10-CM

## 2021-02-19 DIAGNOSIS — W2209XA Striking against other stationary object, initial encounter: Secondary | ICD-10-CM

## 2021-02-19 DIAGNOSIS — S62309A Unspecified fracture of unspecified metacarpal bone, initial encounter for closed fracture: Secondary | ICD-10-CM

## 2021-02-19 MED ORDER — IBUPROFEN 800 MG PO TABS: 800.0000 mg | ORAL_TABLET | Freq: Three times a day (TID) | ORAL | 0 refills | Status: DC

## 2021-02-19 NOTE — Discharge Instructions (Addendum)
Please rest and elevate the affected extremity. If not allergic, you may take Motrin 600mg  every 8 hours or Tylenol 1000mg  every 6 hours or as needed for discomfort. Follow up with orthopaedic surgery within one week for further evaluation. Please call for any appointment. Do not remove your splint. You may use a garbage bag while showering to keep your splint dry. Please return here if you are experiencing increased pain, tingling/numbness, swelling, redness, or fevers.

## 2021-02-19 NOTE — Progress Notes (Signed)
Orthopedic Tech Progress Note Patient Details:  Jerry Brooks 30-Nov-1991 [id number] Applied volar splint to LUE.  Ortho Devices Type of Ortho Device: Short arm splint Ortho Device/Splint Location: LUE Ortho Device/Splint Interventions: Ordered,Application   Post Interventions Patient Tolerated: Well Instructions Provided: Care of device   Kerry Fort 02/19/2021, 1:28 PM

## 2021-02-19 NOTE — ED Notes (Signed)
Ortho tech called 

## 2021-02-19 NOTE — ED Provider Notes (Addendum)
[facility]   [id number] 02/19/21 Arrival Time: 1128  ASSESSMENT & PLAN:  1. Closed fracture of multiple metacarpal bones, initial encounter   2. Left hand pain     I have personally viewed the imaging studies ordered this visit. Proximal 3rd/4th metacarpal fractures.  Volar splint applied by orthopaedic tech.  Recommend:    Follow-up Information    Schedule an appointment as soon as possible for a visit  with Bradly Bienenstock, MD.   Specialty: Orthopedic Surgery Contact information: [address] [place] [address] [phone]               Meds ordered this encounter  Medications  . ibuprofen (ADVIL) 800 MG tablet    Sig: Take 1 tablet (800 mg total) by mouth 3 (three) times daily with meals.    Dispense:  21 tablet    Refill:  0    Reviewed expectations re: course of current medical issues. Questions answered. Outlined signs and symptoms indicating need for more acute intervention. Patient verbalized understanding. After Visit Summary given.  SUBJECTIVE: History from: patient. Jerry Brooks is a 30 y.o. male who reports L hand pain after punching wall today. Some "tingling" but without sensation loss. Able to move all fingers without difficulty. No OTC tx.   Past Surgical History:  Procedure Laterality Date  . CIRCUMCISION REVISION        OBJECTIVE:  Vitals:   02/19/21 1212  BP: (!) 140/94  Pulse: 90  Resp: 20  Temp: 98.1 F (36.7 C)  SpO2: 100%    General appearance: alert; no distress HEENT: [place]; AT Neck: supple with FROM Resp: unlabored respirations Extremities: . LUE: warm with well perfused appearance; poorly localized moderate tenderness over left dorsal proximal hand; without gross deformities; swelling: moderate; bruising: minimal; wrist and fingers with normal ROM CV: brisk extremity capillary refill of RUE; 2+ radial pulse of RUE. Skin: warm and dry; no visible rashes Neurologic: gait normal;  normal sensation and strength of RUE Psychological: alert and cooperative; normal mood and affect  Imaging: DG Hand Complete Left  Result Date: 02/19/2021 CLINICAL DATA:  Pain after punching wall. EXAM: LEFT HAND - COMPLETE 3+ VIEW COMPARISON:  None. FINDINGS: Nondisplaced metadiaphyseal fractures on the proximal third and fourth metacarpals. There is no evidence of arthropathy or other focal bone abnormality. Soft tissues are unremarkable. IMPRESSION: Boxer's type fractures of the proximal third and fourth metacarpals. Electronically Signed   By: Maudry Mayhew MD   On: 02/19/2021 12:55      No Known Allergies  History reviewed. No pertinent past medical history. Social History   Socioeconomic History  . Marital status: Single    Spouse name: Not on file  . Number of children: Not on file  . Years of education: Not on file  . Highest education level: Not on file  Occupational History  . Not on file  Tobacco Use  . Smoking status: Former Smoker    Packs/day: 0.50    Types: Cigarettes  . Smokeless tobacco: Never Used  Vaping Use  . Vaping Use: Never used  Substance and Sexual Activity  . Alcohol use: Yes    Comment: occasional  . Drug use: Yes    Types: Marijuana  . Sexual activity: Not on file  Other Topics Concern  . Not on file  Social History Narrative  . Not on file   Social Determinants of Health   Financial Resource Strain: Not on file  Food Insecurity: Not  on file  Transportation Needs: Not on file  Physical Activity: Not on file  Stress: Not on file  Social Connections: Not on file   Family History  Problem Relation Age of Onset  . Diabetes Other   . Hypertension Other    Past Surgical History:  Procedure Laterality Date  . CIRCUMCISION REVISION        Mardella Layman, MD 02/19/21 Alison Stalling    Mardella Layman, MD 02/19/21 Zollie Pee

## 2021-02-19 NOTE — ED Triage Notes (Addendum)
Pt in with c/o left hand injury that occurred today after he punched a wall  Pt states he has some numbness in his hand  Swelling noted to left hand, no bruising

## 2021-05-02 ENCOUNTER — Emergency Department (HOSPITAL_COMMUNITY)
Admission: EM | Admit: 2021-05-02 | Discharge: 2021-05-02 | Disposition: A | Discharge status: Home / Self Care | Payer: Self-pay | Attending: Emergency Medicine | Admitting: Emergency Medicine

## 2021-05-02 ENCOUNTER — Encounter (HOSPITAL_COMMUNITY): Payer: Self-pay | Admitting: Emergency Medicine

## 2021-05-02 ENCOUNTER — Other Ambulatory Visit: Payer: Self-pay

## 2021-05-02 DIAGNOSIS — K0889 Other specified disorders of teeth and supporting structures: Secondary | ICD-10-CM | POA: Insufficient documentation

## 2021-05-02 DIAGNOSIS — Z5321 Procedure and treatment not carried out due to patient leaving prior to being seen by health care provider: Secondary | ICD-10-CM | POA: Insufficient documentation

## 2021-05-02 MED ORDER — OXYCODONE-ACETAMINOPHEN 5-325 MG PO TABS
1.0000 | ORAL_TABLET | Freq: Once | ORAL | Status: AC
  Administered 2021-05-02: 1 via ORAL
  Filled 2021-05-02: qty 1

## 2021-05-02 NOTE — ED Notes (Signed)
Patient LWBS

## 2021-05-02 NOTE — ED Triage Notes (Signed)
Patient reports right upper/lower dental pain this week .

## 2021-05-08 ENCOUNTER — Encounter (HOSPITAL_COMMUNITY): Payer: Self-pay | Admitting: Emergency Medicine

## 2021-05-08 ENCOUNTER — Other Ambulatory Visit: Payer: Self-pay

## 2021-05-08 ENCOUNTER — Emergency Department (HOSPITAL_COMMUNITY)
Admission: EM | Admit: 2021-05-08 | Discharge: 2021-05-09 | Disposition: A | Discharge status: Home / Self Care | Payer: Self-pay | Attending: Emergency Medicine | Admitting: Emergency Medicine

## 2021-05-08 DIAGNOSIS — Z87891 Personal history of nicotine dependence: Secondary | ICD-10-CM | POA: Insufficient documentation

## 2021-05-08 DIAGNOSIS — K0889 Other specified disorders of teeth and supporting structures: Secondary | ICD-10-CM | POA: Insufficient documentation

## 2021-05-08 MED ORDER — OXYCODONE-ACETAMINOPHEN 5-325 MG PO TABS
1.0000 | ORAL_TABLET | Freq: Once | ORAL | Status: AC
  Administered 2021-05-09: 1 via ORAL
  Filled 2021-05-08: qty 1

## 2021-05-08 MED ORDER — KETOROLAC TROMETHAMINE 60 MG/2ML IM SOLN
30.0000 mg | Freq: Once | INTRAMUSCULAR | Status: DC
  Filled 2021-05-08: qty 2

## 2021-05-08 MED ORDER — MELOXICAM 7.5 MG PO TABS: 7.5000 mg | ORAL_TABLET | Freq: Every day | ORAL | 0 refills | Status: DC

## 2021-05-08 MED ORDER — PENICILLIN V POTASSIUM 250 MG PO TABS
500.0000 mg | ORAL_TABLET | Freq: Once | ORAL | Status: AC
  Administered 2021-05-09: 500 mg via ORAL
  Filled 2021-05-08: qty 2

## 2021-05-08 MED ORDER — PENICILLIN V POTASSIUM 500 MG PO TABS: 500.0000 mg | ORAL_TABLET | Freq: Three times a day (TID) | ORAL | 0 refills | Status: AC

## 2021-05-08 NOTE — ED Provider Notes (Signed)
[facility] EMERGENCY DEPARTMENT Provider Note   CSN: [id number] Arrival date & time: 05/08/21  2220     History Chief Complaint  Patient presents with  . Dental Pain    Jerry Brooks is a 30 y.o. male.   Dental Pain Location:  Upper and lower Upper teeth location:  2/RU 2nd molar Lower teeth location:  31/RL 2nd molar Quality:  Sharp Severity:  Mild Onset quality:  Gradual Timing:  Constant Progression:  Worsening Chronicity:  New Context: not abscess, not dental fracture and not enamel fracture        History reviewed. No pertinent past medical history.  There are no problems to display for this patient.   Past Surgical History:  Procedure Laterality Date  . CIRCUMCISION REVISION         Family History  Problem Relation Age of Onset  . Diabetes Other   . Hypertension Other     Social History   Tobacco Use  . Smoking status: Former Smoker    Packs/day: 0.50    Types: Cigarettes  . Smokeless tobacco: Never Used  Vaping Use  . Vaping Use: Never used  Substance Use Topics  . Alcohol use: Yes    Comment: occasional  . Drug use: Yes    Types: Marijuana    Home Medications Prior to Admission medications   Medication Sig Start Date End Date Taking? Authorizing Provider  meloxicam (MOBIC) 7.5 MG tablet Take 1 tablet (7.5 mg total) by mouth daily. 05/08/21  Yes Meyli Boice, Barbara Cower, MD  penicillin v potassium (VEETID) 500 MG tablet Take 1 tablet (500 mg total) by mouth 3 (three) times daily for 7 days. 05/08/21 05/15/21 Yes Orvella Digiulio, Barbara Cower, MD  cyclobenzaprine (FLEXERIL) 10 MG tablet Take 1 tablet (10 mg total) by mouth 2 (two) times daily as needed for muscle spasms. 02/14/18   McDonald, Mia A, PA-C  doxycycline (VIBRAMYCIN) 100 MG capsule Take 1 capsule (100 mg total) by mouth 2 (two) times daily. 03/02/17   Sharlene Dory, DO  ibuprofen (ADVIL) 800 MG tablet Take 1 tablet (800 mg total) by mouth 3 (three) times daily with meals.  02/19/21   Mardella Layman, MD    Allergies    Patient has no known allergies.  Review of Systems   Review of Systems  All other systems reviewed and are negative.   Physical Exam Updated Vital Signs BP 110/76 (BP Location: Left Arm)   Pulse (!) 102   Temp 97.7 F (36.5 C) (Oral)   Resp 16   Ht 5\' 9"  (1.753 m)   Wt 90.7 kg   SpO2 97%   BMI 29.53 kg/m   Physical Exam Vitals and nursing note reviewed.  Constitutional:      Appearance: He is well-developed.  HENT:     Head: Normocephalic and atraumatic.     Mouth/Throat:     Mouth: Mucous membranes are moist.     Pharynx: Oropharynx is clear.  Eyes:     Pupils: Pupils are equal, round, and reactive to light.  Cardiovascular:     Rate and Rhythm: Normal rate.  Pulmonary:     Effort: Pulmonary effort is normal. No respiratory distress.  Abdominal:     General: Abdomen is flat. There is no distension.  Musculoskeletal:        General: Normal range of motion.     Cervical back: Normal range of motion.  Skin:    General: Skin is warm and dry.  Coloration: Skin is not jaundiced or pale.  Neurological:     General: No focal deficit present.     Mental Status: He is alert.     ED Results / Procedures / Treatments   Labs (all labs ordered are listed, but only abnormal results are displayed) Labs Reviewed - No data to display  EKG None  Radiology No results found.  Procedures Procedures   Medications Ordered in ED Medications  ketorolac (TORADOL) injection 30 mg (has no administration in time range)  oxyCODONE-acetaminophen (PERCOCET/ROXICET) 5-325 MG per tablet 1 tablet (has no administration in time range)  penicillin v potassium (VEETID) tablet 500 mg (has no administration in time range)    ED Course  I have reviewed the triage vital signs and the nursing notes.  Pertinent labs & imaging results that were available during my care of the patient were reviewed by me and considered in my medical  decision making (see chart for details).    MDM Rules/Calculators/A&P                          Dental pain and swelling. Possible infection? No trismus, dysphagia, deep neck space infection. Needs dental follow up.   Final Clinical Impression(s) / ED Diagnoses Final diagnoses:  Pain, dental    Rx / DC Orders ED Discharge Orders         Ordered    meloxicam (MOBIC) 7.5 MG tablet  Daily        05/08/21 2351    penicillin v potassium (VEETID) 500 MG tablet  3 times daily        05/08/21 2351           Destine Ambroise, Barbara Cower, MD 05/08/21 2356

## 2021-05-08 NOTE — ED Triage Notes (Signed)
Pt reports wisdom teeth on the right side are causing him pain.

## 2022-06-12 ENCOUNTER — Ambulatory Visit (HOSPITAL_COMMUNITY)
Admission: EM | Admit: 2022-06-12 | Discharge: 2022-06-12 | Disposition: A | Discharge status: Home / Self Care | Payer: Self-pay | Attending: Physician Assistant | Admitting: Physician Assistant

## 2022-06-12 ENCOUNTER — Encounter (HOSPITAL_COMMUNITY): Payer: Self-pay | Admitting: Emergency Medicine

## 2022-06-12 ENCOUNTER — Ambulatory Visit (INDEPENDENT_AMBULATORY_CARE_PROVIDER_SITE_OTHER): Payer: Self-pay

## 2022-06-12 DIAGNOSIS — T148XXA Other injury of unspecified body region, initial encounter: Secondary | ICD-10-CM

## 2022-06-12 DIAGNOSIS — S91331A Puncture wound without foreign body, right foot, initial encounter: Secondary | ICD-10-CM

## 2022-06-12 DIAGNOSIS — Z23 Encounter for immunization: Secondary | ICD-10-CM

## 2022-06-12 DIAGNOSIS — S99921A Unspecified injury of right foot, initial encounter: Secondary | ICD-10-CM

## 2022-06-12 MED ORDER — CIPROFLOXACIN HCL 500 MG PO TABS: 500.0000 mg | ORAL_TABLET | Freq: Two times a day (BID) | ORAL | 0 refills | Status: AC

## 2022-06-12 MED ORDER — TETANUS-DIPHTH-ACELL PERTUSSIS 5-2.5-18.5 LF-MCG/0.5 IM SUSY
0.5000 mL | PREFILLED_SYRINGE | Freq: Once | INTRAMUSCULAR | Status: AC
  Administered 2022-06-12: 0.5 mL via INTRAMUSCULAR

## 2022-06-12 MED ORDER — TETANUS-DIPHTH-ACELL PERTUSSIS 5-2.5-18.5 LF-MCG/0.5 IM SUSY
PREFILLED_SYRINGE | INTRAMUSCULAR | Status: AC
  Filled 2022-06-12: qty 0.5

## 2022-06-12 MED ORDER — IBUPROFEN 800 MG PO TABS: 800.0000 mg | ORAL_TABLET | Freq: Three times a day (TID) | ORAL | 0 refills | Status: AC

## 2022-06-12 NOTE — Discharge Instructions (Addendum)
Your x-ray was normal.  We updated your tetanus.  Start ciprofloxacin twice daily for 5 days.  Take ibuprofen for pain.  Do not take NSAIDs including aspirin, ibuprofen/Advil, naproxen/Aleve with this medication as it can cause stomach bleeding.  You can use Tylenol for breakthrough pain.  If your symptoms or not improving please follow-up with podiatry; call to schedule an appointment.  If anything worsens and you have fever, increased pain, bleeding, drainage, swelling, numbness, tingling you need to be seen.

## 2022-06-12 NOTE — ED Triage Notes (Signed)
Pt reports stepped on a nail yesterday at work that went through shoe. Pt has pain at right 4th toe. Unsure when last tetanus shot.

## 2022-06-12 NOTE — ED Provider Notes (Addendum)
[facility]    CSN: [id number] Arrival date & time: 06/12/22  0859      History   Chief Complaint Chief Complaint  Patient presents with   Foot Injury    HPI Jerry Brooks is a 31 y.o. male.   Patient presents today with a 1 day history of right foot pain.  He reports that he was walking around and sandals when he stepped on a piece of wood that had a nail protruding.  The nail went through his sandal and into the bottom of his foot.  He did clean this with soap and water but has not been applying any topical medication.  He reports significant pain in the bottom of his foot that radiates into his toes.  Pain is rated 9 on a 0-10 pain scale, described as aching, worse with ambulation, no alleviating factors identified.  Denies previous injury or surgery involving foot.  He denies any numbness or paresthesias.  He is unsure when his last tetanus was.  Denies any recent antibiotic use.    History reviewed. No pertinent past medical history.  There are no problems to display for this patient.   Past Surgical History:  Procedure Laterality Date   CIRCUMCISION REVISION         Home Medications    Prior to Admission medications   Medication Sig Start Date End Date Taking? Authorizing Provider  ciprofloxacin (CIPRO) 500 MG tablet Take 1 tablet (500 mg total) by mouth every 12 (twelve) hours. 06/12/22  Yes Akshaj Besancon K, PA-C  ibuprofen (ADVIL) 800 MG tablet Take 1 tablet (800 mg total) by mouth 3 (three) times daily with meals. 06/12/22   Jennea Rager, Noberto Retort, PA-C    Family History Family History  Problem Relation Age of Onset   Diabetes Other    Hypertension Other     Social History Social History   Tobacco Use   Smoking status: Former    Packs/day: 0.50    Types: Cigarettes   Smokeless tobacco: Never  Vaping Use   Vaping Use: Never used  Substance Use Topics   Alcohol use: Yes    Comment: occasional   Drug use: Yes    Types: Marijuana      Allergies   Patient has no known allergies.   Review of Systems Review of Systems  Constitutional:  Positive for activity change. Negative for appetite change, fatigue and fever.  Musculoskeletal:  Positive for arthralgias and myalgias.  Skin:  Positive for wound. Negative for color change.  Neurological:  Negative for dizziness, weakness, light-headedness, numbness and headaches.     Physical Exam Triage Vital Signs ED Triage Vitals  Enc Vitals Group     BP 06/12/22 1025 133/89     Pulse Rate 06/12/22 1025 100     Resp 06/12/22 1025 17     Temp 06/12/22 1025 98.5 F (36.9 C)     Temp Source 06/12/22 1025 Oral     SpO2 06/12/22 1025 98 %     Weight --      Height --      Head Circumference --      Peak Flow --      Pain Score 06/12/22 1023 9     Pain Loc --      Pain Edu? --      Excl. in GC? --    No data found.  Updated Vital Signs BP 133/89 (BP Location: Left Arm)   Pulse 100  Temp 98.5 F (36.9 C) (Oral)   Resp 17   SpO2 98%   Visual Acuity Right Eye Distance:   Left Eye Distance:   Bilateral Distance:    Right Eye Near:   Left Eye Near:    Bilateral Near:     Physical Exam Vitals reviewed.  Constitutional:      General: He is awake.     Appearance: Normal appearance. He is well-developed. He is not ill-appearing.     Comments: Very pleasant male appears stated age in no acute distress  HENT:     Head: Normocephalic and atraumatic.     Mouth/Throat:     Pharynx: No oropharyngeal exudate, posterior oropharyngeal erythema or uvula swelling.  Cardiovascular:     Rate and Rhythm: Normal rate and regular rhythm.     Pulses:          Posterior tibial pulses are 2+ on the right side and 2+ on the left side.     Heart sounds: Normal heart sounds, S1 normal and S2 normal. No murmur heard.    Comments: Capillary refill within 2 seconds right toes Pulmonary:     Effort: Pulmonary effort is normal.     Breath sounds: Normal breath sounds. No  stridor. No wheezing, rhonchi or rales.     Comments: Clear to auscultation bilaterally Musculoskeletal:       Feet:  Feet:     Right foot:     Protective Sensation: 10 sites tested.  10 sites sensed.     Skin integrity: No ulcer, blister, skin breakdown, erythema or warmth.     Toenail Condition: Right toenails are normal.     Comments: Foot neurovascularly intact. Neurological:     Mental Status: He is alert.  Psychiatric:        Behavior: Behavior is cooperative.      UC Treatments / Results  Labs (all labs ordered are listed, but only abnormal results are displayed) Labs Reviewed - No data to display  EKG   Radiology DG Foot Complete Right  Result Date: 06/12/2022 CLINICAL DATA:  Pain after stepping on nail. EXAM: RIGHT FOOT COMPLETE - 3+ VIEW COMPARISON:  None Available. FINDINGS: Negative for fracture or dislocation. Normal alignment. No evidence for a radiopaque foreign body. IMPRESSION: Negative. Electronically Signed   By: Richarda Overlie M.D.   On: 06/12/2022 10:51    Procedures Procedures (including critical care time)  Medications Ordered in UC Medications  Tdap (BOOSTRIX) injection 0.5 mL (0.5 mLs Intramuscular Given 06/12/22 1049)    Initial Impression / Assessment and Plan / UC Course  I have reviewed the triage vital signs and the nursing notes.  Pertinent labs & imaging results that were available during my care of the patient were reviewed by me and considered in my medical decision making (see chart for details).     X-ray was obtained given severity of pain and to rule out retained foreign body which showed no acute abnormality.  Tetanus was updated today.  Given puncture wound went through the rubber sole of the shoe will cover for Pseudomonas with ciprofloxacin 500 mg twice daily for 5 days.  He was encouraged to use ibuprofen for pain relief with instruction to take additional NSAIDs with this medication due to risk of GI bleeding.  He can take Tylenol  for pain relief.  Discussed that if he has any worsening symptoms he needs to go to the emergency room including increased pain, fever, nausea, vomiting.  If symptoms  are not improving and he continues to have pain he should follow-up with podiatry and was given contact information for local provider.  Strict return precautions given to which he expressed understanding.  Work excuse note provided.  At the end of visit patient reported that he is having difficulty ambulating due to pain.  He requested crutches which were provided for him during visit.  Final Clinical Impressions(s) / UC Diagnoses   Final diagnoses:  Injury of right foot, initial encounter  Puncture wound     Discharge Instructions      Your x-ray was normal.  We updated your tetanus.  Start ciprofloxacin twice daily for 5 days.  Take ibuprofen for pain.  Do not take NSAIDs including aspirin, ibuprofen/Advil, naproxen/Aleve with this medication as it can cause stomach bleeding.  You can use Tylenol for breakthrough pain.  If your symptoms or not improving please follow-up with podiatry; call to schedule an appointment.  If anything worsens and you have fever, increased pain, bleeding, drainage, swelling, numbness, tingling you need to be seen.     ED Prescriptions     Medication Sig Dispense Auth. Provider   ibuprofen (ADVIL) 800 MG tablet Take 1 tablet (800 mg total) by mouth 3 (three) times daily with meals. 21 tablet Bianka Liberati K, PA-C   ciprofloxacin (CIPRO) 500 MG tablet Take 1 tablet (500 mg total) by mouth every 12 (twelve) hours. 10 tablet Nazeer Romney, Noberto Retort, PA-C      PDMP not reviewed this encounter.   Jeani Hawking, PA-C 06/12/22 1103    Shereena Berquist, Noberto Retort, PA-C 06/12/22 1106

## 2024-07-13 ENCOUNTER — Encounter (HOSPITAL_BASED_OUTPATIENT_CLINIC_OR_DEPARTMENT_OTHER): Payer: Self-pay

## 2024-07-13 ENCOUNTER — Other Ambulatory Visit: Payer: Self-pay

## 2024-07-13 ENCOUNTER — Emergency Department (HOSPITAL_BASED_OUTPATIENT_CLINIC_OR_DEPARTMENT_OTHER)
Admission: EM | Admit: 2024-07-13 | Discharge: 2024-07-13 | Disposition: A | Discharge status: Home / Self Care | Payer: Self-pay | Attending: Emergency Medicine | Admitting: Emergency Medicine

## 2024-07-13 ENCOUNTER — Other Ambulatory Visit (HOSPITAL_BASED_OUTPATIENT_CLINIC_OR_DEPARTMENT_OTHER): Payer: Self-pay

## 2024-07-13 DIAGNOSIS — M62838 Other muscle spasm: Secondary | ICD-10-CM | POA: Insufficient documentation

## 2024-07-13 MED ORDER — KETOROLAC TROMETHAMINE 60 MG/2ML IM SOLN
60.0000 mg | Freq: Once | INTRAMUSCULAR | Status: AC
  Administered 2024-07-13: 60 mg via INTRAMUSCULAR
  Filled 2024-07-13: qty 2

## 2024-07-13 MED ORDER — CYCLOBENZAPRINE HCL 10 MG PO TABS
10.0000 mg | ORAL_TABLET | Freq: Two times a day (BID) | ORAL | 0 refills | Status: AC | PRN
  Filled 2024-07-13: qty 20, 10d supply, fill #0

## 2024-07-13 MED ORDER — METHYLPREDNISOLONE 4 MG PO TBPK
ORAL_TABLET | ORAL | 0 refills | Status: AC
  Filled 2024-07-13: qty 21, 6d supply, fill #0
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
## 2024-07-13 NOTE — Discharge Instructions (Signed)
 Recommend 1000 mg of Tylenol  every 6 hours as needed for pain.  Take medications as prescribed.  Cyclobenzaprine  is a muscle relaxant.  This medication is sedating so please be careful with its use.  Do not drive or do dangerous activities while taking this medicine.
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
